# Patient Record
Sex: Female | Born: 1935 | Race: Black or African American | Hispanic: No | Smoking: Never smoker
Health system: Southern US, Community
[De-identification: ages and names within clinical notes are randomized; demographics above are authoritative.]

## PROBLEM LIST (undated history)

## (undated) DIAGNOSIS — E039 Hypothyroidism, unspecified: Secondary | ICD-10-CM

## (undated) DIAGNOSIS — H409 Unspecified glaucoma: Secondary | ICD-10-CM

## (undated) DIAGNOSIS — H5440 Blindness, one eye, unspecified eye: Secondary | ICD-10-CM

## (undated) DIAGNOSIS — I10 Essential (primary) hypertension: Secondary | ICD-10-CM

## (undated) HISTORY — PX: THYROID SURGERY: SHX805

## (undated) HISTORY — PX: ABDOMINAL HYSTERECTOMY: SHX81

## (undated) HISTORY — PX: CHOLECYSTECTOMY: SHX55

## (undated) HISTORY — PX: TONSILLECTOMY: SUR1361

## (undated) HISTORY — PX: APPENDECTOMY: SHX54

## (undated) HISTORY — PX: EYE SURGERY: SHX253

---

## 2002-04-22 ENCOUNTER — Encounter: Payer: Self-pay | Admitting: Family Medicine

## 2002-04-22 ENCOUNTER — Ambulatory Visit (HOSPITAL_COMMUNITY): Admission: RE | Admit: 2002-04-22 | Discharge: 2002-04-22 | Payer: Self-pay | Admitting: Family Medicine

## 2004-12-08 ENCOUNTER — Ambulatory Visit (HOSPITAL_COMMUNITY): Admission: RE | Admit: 2004-12-08 | Discharge: 2004-12-08 | Payer: Self-pay | Admitting: Family Medicine

## 2005-01-16 ENCOUNTER — Emergency Department (HOSPITAL_COMMUNITY): Admission: EM | Admit: 2005-01-16 | Discharge: 2005-01-16 | Payer: Self-pay | Admitting: Emergency Medicine

## 2005-10-25 ENCOUNTER — Emergency Department (HOSPITAL_COMMUNITY): Admission: EM | Admit: 2005-10-25 | Discharge: 2005-10-25 | Payer: Self-pay | Admitting: Emergency Medicine

## 2006-09-04 ENCOUNTER — Ambulatory Visit (HOSPITAL_COMMUNITY): Admission: RE | Admit: 2006-09-04 | Discharge: 2006-09-04 | Payer: Self-pay | Admitting: Family Medicine

## 2006-09-21 ENCOUNTER — Ambulatory Visit (HOSPITAL_COMMUNITY): Admission: RE | Admit: 2006-09-21 | Discharge: 2006-09-21 | Payer: Self-pay | Admitting: Family Medicine

## 2010-06-18 NOTE — Procedures (Signed)
NAME:  [name] [name], [name] [name] NO.:  [id number]   MEDICAL RECORD NO.:  [id number]          PATIENT TYPE:  EMS   LOCATION:  ED                            FACILITY:  [facility]   PHYSICIAN:  Edward L. Juanetta Gosling, M.D.DATE OF BIRTH:  06/30/1931   DATE OF PROCEDURE:  01/18/2005  DATE OF DISCHARGE:  01/16/2005                                EKG INTERPRETATION   The rhythm is sinus tachycardia with a rate of about 1:10.  QRS becomes  positive very early in the precordial leads.  There is probable left atrial  enlargement, and there are diffuse ST-T wave changes which are nonspecific.  Had normal electrocardiogram.      Ramon Dredge L. Juanetta Gosling, M.D.  Electronically Signed     ELH/MEDQ  D:  01/18/2005  T:  01/19/2005  Job:  [id number]

## 2010-06-21 ENCOUNTER — Emergency Department (HOSPITAL_COMMUNITY): Payer: Medicare (Managed Care)

## 2010-06-21 ENCOUNTER — Emergency Department (HOSPITAL_COMMUNITY)
Admission: EM | Admit: 2010-06-21 | Discharge: 2010-06-21 | Disposition: A | Discharge status: Home / Self Care | Payer: Medicare (Managed Care) | Attending: Emergency Medicine | Admitting: Emergency Medicine

## 2010-06-21 DIAGNOSIS — Z7982 Long term (current) use of aspirin: Secondary | ICD-10-CM | POA: Insufficient documentation

## 2010-06-21 DIAGNOSIS — E119 Type 2 diabetes mellitus without complications: Secondary | ICD-10-CM | POA: Insufficient documentation

## 2010-06-21 DIAGNOSIS — H409 Unspecified glaucoma: Secondary | ICD-10-CM | POA: Insufficient documentation

## 2010-06-21 DIAGNOSIS — I10 Essential (primary) hypertension: Secondary | ICD-10-CM | POA: Insufficient documentation

## 2010-06-21 DIAGNOSIS — M549 Dorsalgia, unspecified: Secondary | ICD-10-CM | POA: Insufficient documentation

## 2010-06-21 DIAGNOSIS — Z79899 Other long term (current) drug therapy: Secondary | ICD-10-CM | POA: Insufficient documentation

## 2010-06-21 DIAGNOSIS — E039 Hypothyroidism, unspecified: Secondary | ICD-10-CM | POA: Insufficient documentation

## 2010-06-21 DIAGNOSIS — M25519 Pain in unspecified shoulder: Secondary | ICD-10-CM | POA: Insufficient documentation

## 2010-11-12 ENCOUNTER — Other Ambulatory Visit (HOSPITAL_COMMUNITY): Payer: Self-pay | Admitting: Family Medicine

## 2010-11-12 DIAGNOSIS — E232 Diabetes insipidus: Secondary | ICD-10-CM

## 2010-11-12 DIAGNOSIS — I10 Essential (primary) hypertension: Secondary | ICD-10-CM

## 2010-11-12 DIAGNOSIS — E785 Hyperlipidemia, unspecified: Secondary | ICD-10-CM

## 2010-12-06 ENCOUNTER — Ambulatory Visit (HOSPITAL_COMMUNITY)
Admission: RE | Admit: 2010-12-06 | Discharge: 2010-12-06 | Disposition: A | Discharge status: Home / Self Care | Payer: Medicare Other | Source: Ambulatory Visit | Attending: Family Medicine | Admitting: Family Medicine

## 2010-12-06 DIAGNOSIS — E785 Hyperlipidemia, unspecified: Secondary | ICD-10-CM | POA: Insufficient documentation

## 2010-12-06 DIAGNOSIS — I10 Essential (primary) hypertension: Secondary | ICD-10-CM | POA: Insufficient documentation

## 2010-12-06 DIAGNOSIS — R109 Unspecified abdominal pain: Secondary | ICD-10-CM | POA: Insufficient documentation

## 2010-12-06 DIAGNOSIS — E119 Type 2 diabetes mellitus without complications: Secondary | ICD-10-CM | POA: Insufficient documentation

## 2010-12-06 DIAGNOSIS — E232 Diabetes insipidus: Secondary | ICD-10-CM

## 2011-05-06 ENCOUNTER — Other Ambulatory Visit (HOSPITAL_COMMUNITY): Payer: Self-pay | Admitting: Family Medicine

## 2011-05-06 DIAGNOSIS — Z139 Encounter for screening, unspecified: Secondary | ICD-10-CM

## 2011-05-11 ENCOUNTER — Ambulatory Visit (HOSPITAL_COMMUNITY)
Admission: RE | Admit: 2011-05-11 | Discharge: 2011-05-11 | Disposition: A | Discharge status: Home / Self Care | Payer: Medicare Other | Source: Ambulatory Visit | Attending: Family Medicine | Admitting: Family Medicine

## 2011-05-11 DIAGNOSIS — Z780 Asymptomatic menopausal state: Secondary | ICD-10-CM | POA: Insufficient documentation

## 2011-05-11 DIAGNOSIS — M899 Disorder of bone, unspecified: Secondary | ICD-10-CM | POA: Insufficient documentation

## 2011-05-19 ENCOUNTER — Encounter (HOSPITAL_COMMUNITY): Payer: Self-pay

## 2011-05-19 ENCOUNTER — Ambulatory Visit (HOSPITAL_COMMUNITY)
Admission: RE | Admit: 2011-05-19 | Discharge: 2011-05-19 | Disposition: A | Discharge status: Home / Self Care | Payer: Medicare Other | Source: Ambulatory Visit | Attending: Family Medicine | Admitting: Family Medicine

## 2011-05-19 DIAGNOSIS — Z1231 Encounter for screening mammogram for malignant neoplasm of breast: Secondary | ICD-10-CM | POA: Insufficient documentation

## 2011-05-19 DIAGNOSIS — Z139 Encounter for screening, unspecified: Secondary | ICD-10-CM

## 2011-05-19 HISTORY — DX: Essential (primary) hypertension: I10

## 2012-08-02 ENCOUNTER — Other Ambulatory Visit (HOSPITAL_COMMUNITY): Payer: Self-pay | Admitting: Family Medicine

## 2012-08-02 DIAGNOSIS — Z139 Encounter for screening, unspecified: Secondary | ICD-10-CM

## 2012-08-07 ENCOUNTER — Ambulatory Visit (HOSPITAL_COMMUNITY)
Admission: RE | Admit: 2012-08-07 | Discharge: 2012-08-07 | Disposition: A | Discharge status: Home / Self Care | Payer: Medicare Other | Source: Ambulatory Visit | Attending: Family Medicine | Admitting: Family Medicine

## 2012-08-07 DIAGNOSIS — Z139 Encounter for screening, unspecified: Secondary | ICD-10-CM

## 2012-08-07 DIAGNOSIS — Z1231 Encounter for screening mammogram for malignant neoplasm of breast: Secondary | ICD-10-CM | POA: Insufficient documentation

## 2012-08-14 ENCOUNTER — Encounter (HOSPITAL_COMMUNITY): Payer: Self-pay | Admitting: Pharmacy Technician

## 2012-08-15 ENCOUNTER — Other Ambulatory Visit: Payer: Self-pay | Admitting: Ophthalmology

## 2012-08-15 DIAGNOSIS — H4051X0 Glaucoma secondary to other eye disorders, right eye, stage unspecified: Secondary | ICD-10-CM

## 2012-08-15 MED ORDER — TETRACAINE HCL 0.5 % OP SOLN: 1.0000 [drp] | OPHTHALMIC | Status: DC

## 2012-08-15 MED ORDER — MITOMYCIN 0.2 MG OP KIT: 0.2000 mg | PACK | Freq: Once | OPHTHALMIC | Status: DC

## 2012-08-15 NOTE — H&P (Signed)
History & Physical:   DATE:   07-19-12  NAME:  [name] [name], [name] [name]      [id number]       HISTORY OF PRESENT ILLNESS: Chief Eye Complaints   Glaucoma  patient  : 1 month follow up from D/C Travatan Z and switching to Lumingan.  patient  states she hasn't noticed any reactions from switching drops  HPI: EYES: Reports symptoms of     LOCATION:      QUALITY/COURSE:   Reports condition is   INTENSITY/SEVERITY:    Reports measurement ( or degree) as   DURATION:   Reports the general length of symptoms to be   ONSET/TIMING:   Reports occurrence as   CONTEXT/WHEN:   Reports usually associated with   MODIFIERS/TREATMENTS:  Improved by              ROS:   GEN- Constitutional: HENT: GEN - Endocrine: Reports symptoms of diabetes.    hypothyroid LUNGS/Respiratory:  HEART/Cardiovascular: Reports symptoms of hypertension.    ABD/Gastrointestinal:   Musculoskeletal (BJE): Reports symptoms of knee pain or problems.    NEURO/Neurological: PSYCH/Psychiatric:    Is the pt oriented to time, place,yes person?  Mood depressed __ normal  agitated __  ACTIVE PROBLEMS: Chronic angle-closure glaucoma   ICD#365.23  with preexisting scarring un controlled OS despite MTMT Progressive angle closure glaucoma with arcuate defect OD, OS with double arcuate defect and nasal step indicating progressive loss of vision and severe optic nerve damage left eye Ptosis   ICD#374.30   Bilateral pseudophakia Bilateral previous penetrating keratoplasty Diabetes - Type 2   ICD#250.00  no diabetic retinopathy detected Hypertensive retinopathy   ICD#362.11  mild  SURGERIES: SLT 3-4 times OU  Dr. Jola Babinski List - Surgeries    phaco emulsion cataract extraction w/IOL and cornea transplant OU Dr.  Cleora Fleet [facility] in [place]   years ago  MEDICATIONS: Lumigan: 0.01% solution SIG-  1 gtt ou qhs   1 gtt OU qhs  Cosopt (Dorzolamide-Hydrochloride-Timolol Mal):    2%-0.5% (solution)    SIG-    1 milliliter(s)  drop     2 times a day              Brimonidine Tartrate: Strength-  SIG-    OU BID  Metformin (Glucophage):   1000 mg tablet  SIG-  1 tab(s)   2 times a day    Aspirin:  81 mg tablet  SIG-  1 each   once a day    Amlodipine (Norvasc):   10 mg tablet  SIG-  1 each   once a day    Levothyroxine (Synthroid):   100 mcg (0.1 mg) tablet  SIG-  1 each   once a day  REVIEW OF SYSTEMS: not found  TOBACCO: Never smoker   ICD#V13.89   Smoker Status:     Tobacco use:     Tobacco cessation:  SOCIAL HISTORY: Herbalist List - Social History  FAMILY HISTORY: Positive family history for  -   Diabetes/HTN Negative family history for  -   PARENTS: Mother CHILDREN: Son Diabetes GRANDPARENTS: SIBLINGS: UNCLES/AUNTS: OTHERS/DISTANT:  ALLERGIES: diamox caused sickness HYDROCODONE - (e.g. VICODIN):   Starter - Allergies - Summary:  PHYSICAL EXAMINATION: Exam: GENERAL: Appearance: General appearance can be described as well-nourished, well-developed, and in no acute distress.         [name]  OD:cc 20/50 + ph   20/40- OS:cc 20/50+  ph  NI  EYEGLASSES:  OD:-1.00 + 1.00 x 003                                           OS:- 2.75 + 2.00 x 141 ADD:+3.00  MR   OD   defer OS ADD  VF:  OD   full to confrontation testing         OS full to confrontation testing  Motility orthophoria and full  PUPILS: 4 mm each eye sluggish reaction each eye  EYELIDS & OCULAR ADNEXA ptosis each eye  SLE: Conjunctiva 1 hyperemia OU , OS superior nasal & temp conj mobile   Cornea arcus decreased tear film plus one to 2 staining each eye The patient has a clear corneal graft both eyes   anterior chamber  deep and quiet each eye  Iris Brown each eye with atrophy noted OD  Lens Posterior chamber intraocular lens implant each eye  Vitreous  CCT:06/15/2012 11:59  OD:643 OS:611   Ta   in mmHg    OD 20          OS 25 Time 10:45 AM  Gonio  OD reveals an inferior  peripheral anterior synechiae trabecular meshwork visible at 3:30 to 4:30 large  PAS at 9:00.  Angle open from 9:30 to 12:00 PAS noted at 12:00 nasal angle closed   OS angle closed from 6:00 to 9:00 angle open to posterior trabecular meshwork from 9:00 to 12:00 large peripheral anterior synechiae noted at 1 to 2:00 angle open from 1:00 to 3:00 rest of the angle was closed   Dilation:cyclopentolate1% OU (for OCT) not full dilated because of elevated intraocular pressure and several areas of angle-closure  Fundus:  optic nerve  OD  temporal rim discoloration and sloping and with 65% cupping                                                                   OS 90% vertical cup.  Thin temporal rim with discoloration   Macula      OD  the light reflex                OS the light reflex  Vessels narrow arterioles  Periphery retina appears flat each eye  OCT: ZO:XWRUE nasal defect OS: very thin NFL   Exam: GENERAL: Appearance: General appearance can be described as well-nourished, well-developed, and in no acute distress.    LYMPHATIC: HEAD, EARS, NOSE AND THROAT: Ears-Nose (external) Inspection: Externally, nose and ears are normal in appearance and without scars, lesions, or nodules.      Otoscopic Exam: External auditory canals and tympanic membranes are normal.      Hearing assessment shows no problems with normal conversation.    Nose exam, internally, reveals nasal mucosa, septum and turbinates are unremarkable.    Teeth, Gingiva, and Lip Exams: No lesions or evidence of infection.      Oropharynx demonstrates oral mucosa, salivary glands, tongue, tonsils, posterior pharynx, hard-soft palates are normal.  EYES: see above  NECK: Neck tissue exam demonstrates no masses, symmetrical, and trachea  is midline.      LUNGS and RESPIRATORY: Lung auscultation elicits no wheezing, rhonci, rales or rubs and with equal breath sounds.    Respiratory effort described as breathing is unlabored and  chest movement is symmetrical.    HEART (Cardiovascular): Heart auscultation discovers regular rate and rhythm; no murmur, gallop or rub. Normal heart sounds.    ABDOMEN (Gastrointestinal): Mass/Tenderness Exam: Neither are present.     Liver/Spleen: No hepatomegaly or splenomegaly.   MUSCULOSKELETAL (BJE): Inspection-Palpation: No major bone, joint, tendon, or muscle changes.      NEUROLOGICAL: Alert and oriented. No major deficits of coordination or sensation.      PSYCHIATRIC: Insight and judgment appear  both to be intact and appropriate.    Mood and affect are described as normal mood and full affect.    SKIN: Skin Inspection: No rashes or lesions.     BP:. 130/66 PULSE:  72 RESP:16  ADMITTING DIAGNOSIS: Chronic angle-closure glaucoma   ICD#365.23  with preexisting scarring un controlled OS despite MTMT Progressive angle closure glaucoma with arcuate defect OD, OS with double arcuate defect and nasal step indicating progressive loss of vision and severe optic nerve damage left eye Ptosis   ICD#374.30   Bilateral pseudophakia Bilateral previous penetrating keratoplasty Diabetes - Type 2   ICD#250.00  no diabetic retinopathy detected Hypertensive retinopathy   ICD#362.11  mild  SURGICAL TREATMENT PLAN: Suggest trabec with [facility] with  PERIPHERAL  IRIDECTOMY   OS since MTMT not effective.  Risk and benefits of surgery have been reviewed with the patient and the patient agrees to proceed with the surgical procedure.    ___________________________ Chalmers Guest, Tawni Millers - Inactive Problems:

## 2012-08-15 NOTE — Pre-Procedure Instructions (Signed)
TEVA BRONKEMA  08/15/2012   Your procedure is scheduled on: Wednesday August 22, 2012  Report to [facility] at 0800 AM.  Call this number if you have problems the morning of surgery: [phone]   Remember:   Do not eat food or drink liquids after midnight.   Take these medicines the morning of surgery with A SIP OF WATER: Amlodipine, and Synthroid   Do not wear jewelry, make-up or nail polish.  Do not wear lotions, powders, or perfumes. You may wear deodorant.  Do not shave 48 hours prior to surgery.   Do not bring valuables to the hospital.  [facility] is not responsible for any belongings or valuables.  Contacts, dentures or bridgework may not be worn into surgery.  Leave suitcase in the car. After surgery it may be brought to your room.  For patients admitted to the hospital, checkout time is 11:00 AM the day of  discharge.   Patients discharged the day of surgery will not be allowed to drive  home.  Name and phone number of your driver:   Special Instructions: Shower using CHG 2 nights before surgery and the night before surgery.  If you shower the day of surgery use CHG.  Use special wash - you have one bottle of CHG for all showers.  You should use approximately 1/3 of the bottle for each shower.   Please read over the following fact sheets that you were given: Pain Booklet, Coughing and Deep Breathing and Surgical Site Infection Prevention

## 2012-08-15 NOTE — Progress Notes (Signed)
Dr Hosie Poisson office made aware that orders were needed for PAT at 0900 AM 08/16/12.

## 2012-08-16 ENCOUNTER — Encounter (HOSPITAL_COMMUNITY)
Admission: RE | Admit: 2012-08-16 | Discharge: 2012-08-16 | Disposition: A | Discharge status: Home / Self Care | Payer: Medicare Other | Source: Ambulatory Visit | Attending: Ophthalmology | Admitting: Ophthalmology

## 2012-08-16 ENCOUNTER — Encounter (HOSPITAL_COMMUNITY): Payer: Self-pay

## 2012-08-16 VITALS — BP 163/72 | HR 87 | Temp 98.2°F | Resp 18 | Ht 63.0 in | Wt 150.3 lb

## 2012-08-16 DIAGNOSIS — H4051X0 Glaucoma secondary to other eye disorders, right eye, stage unspecified: Secondary | ICD-10-CM

## 2012-08-16 HISTORY — DX: Hypothyroidism, unspecified: E03.9

## 2012-08-16 LAB — CBC
Hemoglobin: 12.8 g/dL (ref 12.0–15.0)
MCH: 28.4 pg (ref 26.0–34.0)
Platelets: 366 10*3/uL (ref 150–400)
RBC: 4.51 MIL/uL (ref 3.87–5.11)
WBC: 9.9 10*3/uL (ref 4.0–10.5)

## 2012-08-16 LAB — BASIC METABOLIC PANEL
Calcium: 10.4 mg/dL (ref 8.4–10.5)
GFR calc Af Amer: 75 mL/min — ABNORMAL LOW (ref >90)
GFR calc non Af Amer: 64 mL/min — ABNORMAL LOW (ref >90)
Potassium: 4.3 mEq/L (ref 3.5–5.1)
Sodium: 140 mEq/L (ref 135–145)

## 2012-08-21 ENCOUNTER — Other Ambulatory Visit: Payer: Self-pay | Admitting: Ophthalmology

## 2012-08-22 ENCOUNTER — Encounter (HOSPITAL_COMMUNITY): Payer: Self-pay | Admitting: Anesthesiology

## 2012-08-22 ENCOUNTER — Ambulatory Visit (HOSPITAL_COMMUNITY)
Admission: RE | Admit: 2012-08-22 | Discharge: 2012-08-22 | Disposition: A | Discharge status: Home / Self Care | Payer: Medicare Other | Source: Ambulatory Visit | Attending: Ophthalmology | Admitting: Ophthalmology

## 2012-08-22 ENCOUNTER — Encounter (HOSPITAL_COMMUNITY)
Admission: RE | Disposition: A | Discharge status: Home / Self Care | Payer: Self-pay | Source: Ambulatory Visit | Attending: Ophthalmology

## 2012-08-22 ENCOUNTER — Ambulatory Visit (HOSPITAL_COMMUNITY): Payer: Medicare Other | Admitting: Anesthesiology

## 2012-08-22 DIAGNOSIS — Z885 Allergy status to narcotic agent status: Secondary | ICD-10-CM | POA: Insufficient documentation

## 2012-08-22 DIAGNOSIS — F3289 Other specified depressive episodes: Secondary | ICD-10-CM | POA: Insufficient documentation

## 2012-08-22 DIAGNOSIS — H02409 Unspecified ptosis of unspecified eyelid: Secondary | ICD-10-CM | POA: Insufficient documentation

## 2012-08-22 DIAGNOSIS — Z79899 Other long term (current) drug therapy: Secondary | ICD-10-CM | POA: Insufficient documentation

## 2012-08-22 DIAGNOSIS — E119 Type 2 diabetes mellitus without complications: Secondary | ICD-10-CM | POA: Insufficient documentation

## 2012-08-22 DIAGNOSIS — F329 Major depressive disorder, single episode, unspecified: Secondary | ICD-10-CM | POA: Insufficient documentation

## 2012-08-22 DIAGNOSIS — H4020X0 Unspecified primary angle-closure glaucoma, stage unspecified: Secondary | ICD-10-CM | POA: Insufficient documentation

## 2012-08-22 DIAGNOSIS — H409 Unspecified glaucoma: Secondary | ICD-10-CM | POA: Insufficient documentation

## 2012-08-22 DIAGNOSIS — H4051X0 Glaucoma secondary to other eye disorders, right eye, stage unspecified: Secondary | ICD-10-CM

## 2012-08-22 DIAGNOSIS — Z7982 Long term (current) use of aspirin: Secondary | ICD-10-CM | POA: Insufficient documentation

## 2012-08-22 DIAGNOSIS — E039 Hypothyroidism, unspecified: Secondary | ICD-10-CM | POA: Insufficient documentation

## 2012-08-22 DIAGNOSIS — I10 Essential (primary) hypertension: Secondary | ICD-10-CM | POA: Insufficient documentation

## 2012-08-22 HISTORY — PX: TRABECULECTOMY: SHX107

## 2012-08-22 LAB — GLUCOSE, CAPILLARY: Glucose-Capillary: 125 mg/dL — ABNORMAL HIGH (ref 70–99)

## 2012-08-22 SURGERY — TRABECULECTOMY
Anesthesia: Monitor Anesthesia Care | Site: Eye | Laterality: Left | Wound class: Clean

## 2012-08-22 MED ORDER — PROPOFOL INFUSION 10 MG/ML OPTIME
INTRAVENOUS | Status: DC | PRN
  Administered 2012-08-22: 75 ug/kg/min via INTRAVENOUS

## 2012-08-22 MED ORDER — MITOMYCIN 0.2 MG OP KIT
0.2000 mg | PACK | OPHTHALMIC | Status: AC
  Administered 2012-08-22: .4 mg via OPHTHALMIC
  Filled 2012-08-22: qty 1

## 2012-08-22 MED ORDER — LIDOCAINE HCL 2 % IJ SOLN
INTRAMUSCULAR | Status: AC
  Filled 2012-08-22: qty 20

## 2012-08-22 MED ORDER — ATROPINE SULFATE 1 % OP SOLN
OPHTHALMIC | Status: AC
  Filled 2012-08-22: qty 2

## 2012-08-22 MED ORDER — SODIUM CHLORIDE 0.9 % IV SOLN
INTRAVENOUS | Status: DC
  Administered 2012-08-22: 09:00:00 via INTRAVENOUS

## 2012-08-22 MED ORDER — ACETYLCHOLINE CHLORIDE 1:100 IO SOLR
INTRAOCULAR | Status: AC
  Filled 2012-08-22: qty 1

## 2012-08-22 MED ORDER — LIDOCAINE HCL 1 % IJ SOLN
INTRAMUSCULAR | Status: DC | PRN
  Administered 2012-08-22: 50 mg via INTRADERMAL

## 2012-08-22 MED ORDER — BSS IO SOLN
INTRAOCULAR | Status: DC | PRN
  Administered 2012-08-22: 500 mL via INTRAOCULAR

## 2012-08-22 MED ORDER — TOBRAMYCIN 0.3 % OP OINT
TOPICAL_OINTMENT | OPHTHALMIC | Status: DC | PRN
  Administered 2012-08-22: 1 via OPHTHALMIC

## 2012-08-22 MED ORDER — SODIUM HYALURONATE 10 MG/ML IO SOLN
INTRAOCULAR | Status: DC | PRN
  Administered 2012-08-22: 0.85 mL via INTRAOCULAR

## 2012-08-22 MED ORDER — TRIAMCINOLONE ACETONIDE 40 MG/ML IJ SUSP
INTRAMUSCULAR | Status: DC | PRN
  Administered 2012-08-22: 40 mg

## 2012-08-22 MED ORDER — TRIAMCINOLONE ACETONIDE 40 MG/ML IJ SUSP
INTRAMUSCULAR | Status: AC
  Filled 2012-08-22: qty 5

## 2012-08-22 MED ORDER — LIDOCAINE-EPINEPHRINE 2 %-1:100000 IJ SOLN
INTRAMUSCULAR | Status: AC
  Filled 2012-08-22: qty 1

## 2012-08-22 MED ORDER — GATIFLOXACIN 0.5 % OP SOLN
OPHTHALMIC | Status: AC
  Administered 2012-08-22: 1 [drp]
  Filled 2012-08-22: qty 2.5

## 2012-08-22 MED ORDER — ATROPINE SULFATE 1 % OP OINT
TOPICAL_OINTMENT | OPHTHALMIC | Status: DC | PRN
  Administered 2012-08-22: 1 via OPHTHALMIC

## 2012-08-22 MED ORDER — SODIUM HYALURONATE 10 MG/ML IO SOLN
INTRAOCULAR | Status: AC
  Filled 2012-08-22: qty 0.85

## 2012-08-22 MED ORDER — FLUORESCEIN SODIUM 1 MG OP STRP
ORAL_STRIP | OPHTHALMIC | Status: DC | PRN
  Administered 2012-08-22: 1 via OPHTHALMIC

## 2012-08-22 MED ORDER — HYALURONIDASE HUMAN 150 UNIT/ML IJ SOLN
INTRAMUSCULAR | Status: AC
  Filled 2012-08-22: qty 1

## 2012-08-22 MED ORDER — PROPOFOL 10 MG/ML IV BOLUS
INTRAVENOUS | Status: DC | PRN
  Administered 2012-08-22 (×2): 30 mg via INTRAVENOUS

## 2012-08-22 MED ORDER — BUPIVACAINE HCL (PF) 0.75 % IJ SOLN
INTRAMUSCULAR | Status: AC
  Filled 2012-08-22: qty 10

## 2012-08-22 MED ORDER — TETRACAINE HCL 0.5 % OP SOLN
OPHTHALMIC | Status: AC
  Filled 2012-08-22: qty 2

## 2012-08-22 MED ORDER — SODIUM CHLORIDE 0.9 % IV SOLN
INTRAVENOUS | Status: DC | PRN
  Administered 2012-08-22: 10:00:00 via INTRAVENOUS

## 2012-08-22 MED ORDER — MIDAZOLAM HCL 5 MG/5ML IJ SOLN
INTRAMUSCULAR | Status: DC | PRN
  Administered 2012-08-22 (×2): 1 mg via INTRAVENOUS

## 2012-08-22 MED ORDER — FLUORESCEIN SODIUM 1 MG OP STRP
ORAL_STRIP | OPHTHALMIC | Status: AC
  Filled 2012-08-22: qty 2

## 2012-08-22 MED ORDER — EPINEPHRINE HCL 1 MG/ML IJ SOLN
INTRAMUSCULAR | Status: AC
  Filled 2012-08-22: qty 1

## 2012-08-22 MED ORDER — BSS IO SOLN
INTRAOCULAR | Status: AC
  Filled 2012-08-22: qty 15

## 2012-08-22 MED ORDER — TOBRAMYCIN-DEXAMETHASONE 0.3-0.1 % OP OINT
TOPICAL_OINTMENT | OPHTHALMIC | Status: AC
  Filled 2012-08-22: qty 3.5

## 2012-08-22 MED ORDER — LIDOCAINE-EPINEPHRINE 2 %-1:100000 IJ SOLN
INTRAMUSCULAR | Status: DC | PRN
  Administered 2012-08-22: 11:00:00 via RETROBULBAR

## 2012-08-22 MED ORDER — BSS IO SOLN
INTRAOCULAR | Status: AC
  Filled 2012-08-22: qty 500

## 2012-08-22 MED ORDER — GATIFLOXACIN 0.5 % OP SOLN
1.0000 [drp] | OPHTHALMIC | Status: AC
  Administered 2012-08-22 (×3): 1 [drp] via OPHTHALMIC

## 2012-08-22 MED ORDER — FENTANYL CITRATE 0.05 MG/ML IJ SOLN
INTRAMUSCULAR | Status: DC | PRN
  Administered 2012-08-22 (×3): 50 ug via INTRAVENOUS

## 2012-08-22 MED ORDER — ACETAMINOPHEN 325 MG PO TABS
ORAL_TABLET | ORAL | Status: AC
  Administered 2012-08-22: 325 mg via ORAL
  Filled 2012-08-22: qty 2

## 2012-08-22 SURGICAL SUPPLY — 50 items
APL SRG 3 HI ABS STRL LF PLS (MISCELLANEOUS) ×1
APPLICATOR COTTON TIP 6IN STRL (MISCELLANEOUS) ×2 IMPLANT
APPLICATOR DR MATTHEWS STRL (MISCELLANEOUS) ×2 IMPLANT
BLADE EYE CATARACT 19 1.4 BEAV (BLADE) ×1 IMPLANT
BLADE MINI RND TIP GREEN BEAV (BLADE) IMPLANT
BLADE STAB KNIFE 45DEG (BLADE) ×2 IMPLANT
CANISTER SUCTION 2500CC (MISCELLANEOUS) ×1 IMPLANT
CLOTH BEACON ORANGE TIMEOUT ST (SAFETY) ×2 IMPLANT
CORDS BIPOLAR (ELECTRODE) ×2 IMPLANT
COVER MAYO STAND STRL (DRAPES) ×1 IMPLANT
DRAPE OPHTHALMIC 40X48 W POUCH (DRAPES) ×2 IMPLANT
DRAPE RETRACTOR (MISCELLANEOUS) ×2 IMPLANT
ERASER HMR WETFIELD 23G BP (MISCELLANEOUS) IMPLANT
GLOVE BIO SURGEON STRL SZ8 (GLOVE) ×2 IMPLANT
GLOVE BIOGEL PI IND STRL 7.0 (GLOVE) IMPLANT
GLOVE BIOGEL PI INDICATOR 7.0 (GLOVE) ×1
GLOVE ECLIPSE 6.5 STRL STRAW (GLOVE) ×1 IMPLANT
GLOVE ECLIPSE 7.0 STRL STRAW (GLOVE) ×2 IMPLANT
GLOVE SURG SS PI 6.5 STRL IVOR (GLOVE) ×1 IMPLANT
GOWN STRL NON-REIN LRG LVL3 (GOWN DISPOSABLE) ×5 IMPLANT
KIT BASIN OR (CUSTOM PROCEDURE TRAY) ×2 IMPLANT
KIT ROOM TURNOVER OR (KITS) ×2 IMPLANT
KNIFE GRIESHABER SHARP 2.5MM (MISCELLANEOUS) ×2 IMPLANT
MASK EYE SHIELD (GAUZE/BANDAGES/DRESSINGS) ×1 IMPLANT
NDL 25GX 5/8IN NON SAFETY (NEEDLE) ×1 IMPLANT
NDL HYPO 30X.5 LL (NEEDLE) ×1 IMPLANT
NEEDLE 25GX 5/8IN NON SAFETY (NEEDLE) ×2 IMPLANT
NEEDLE HYPO 30X.5 LL (NEEDLE) ×4 IMPLANT
NS IRRIG 1000ML POUR BTL (IV SOLUTION) ×2 IMPLANT
PACK CATARACT CUSTOM (CUSTOM PROCEDURE TRAY) ×2 IMPLANT
PAD ARMBOARD 7.5X6 YLW CONV (MISCELLANEOUS) ×4 IMPLANT
PAD EYE OVAL STERILE LF (GAUZE/BANDAGES/DRESSINGS) ×2 IMPLANT
SPEAR EYE SURG WECK-CEL (MISCELLANEOUS) IMPLANT
SPECIMEN JAR SMALL (MISCELLANEOUS) IMPLANT
SPONGE SURGIFOAM ABS GEL 12-7 (HEMOSTASIS) ×2 IMPLANT
STRIP CLOSURE SKIN 1/2X4 (GAUZE/BANDAGES/DRESSINGS) ×1 IMPLANT
SUT ETHILON 10 0 CS140 6 (SUTURE) ×2 IMPLANT
SUT ETHILON 9 0 BV100 4 (SUTURE) ×1 IMPLANT
SUT SILK 6 0 G 6 (SUTURE) ×2 IMPLANT
SUT VICRYL 9-0 (SUTURE) ×1 IMPLANT
SYR 20CC LL (SYRINGE) ×4 IMPLANT
SYR 50ML SLIP (SYRINGE) ×2 IMPLANT
SYR TB 1ML LUER SLIP (SYRINGE) IMPLANT
TAPE PAPER MEDFIX 1IN X 10YD (GAUZE/BANDAGES/DRESSINGS) ×1 IMPLANT
TAPE SURG TRANSPORE 1 IN (GAUZE/BANDAGES/DRESSINGS) IMPLANT
TAPE SURGICAL TRANSPORE 1 IN (GAUZE/BANDAGES/DRESSINGS) ×1
TOWEL OR 17X24 6PK STRL BLUE (TOWEL DISPOSABLE) ×4 IMPLANT
TUBE CONNECTING 12X1/4 (SUCTIONS) ×1 IMPLANT
WATER STERILE IRR 1000ML POUR (IV SOLUTION) ×2 IMPLANT
WIPE INSTRUMENT VISIWIPE 73X73 (MISCELLANEOUS) ×2 IMPLANT

## 2012-08-22 NOTE — Preoperative (Signed)
Beta Blockers   Reason not to administer Beta Blockers:Not Applicable 

## 2012-08-22 NOTE — Anesthesia Postprocedure Evaluation (Signed)
Anesthesia Post Note  Patient: [name] [name]  Procedure(s) Performed: Procedure(s) (LRB): TRABECULECTOMY (Left) MITOMYCIN C APPLICATION (Left)  Anesthesia type: MAC  Patient location: PACU  Post pain: Pain level controlled  Post assessment: Patient's Cardiovascular Status Stable  Last Vitals:  Filed Vitals:   08/22/12 1230  BP:   Pulse: 72  Temp:   Resp:     Post vital signs: Reviewed and stable  Level of consciousness: alert  Complications: No apparent anesthesia complications

## 2012-08-22 NOTE — H&P (View-Only) (Signed)
                  History & Physical:   DATE:   07-19-12  NAME:  [name], [name]      [id number]       HISTORY OF PRESENT ILLNESS: Chief Eye Complaints   Glaucoma  patient  : 1 month follow up from D/C Travatan Z and switching to Lumingan.  patient  states she hasn't noticed any reactions from switching drops  HPI: EYES: Reports symptoms of     LOCATION:      QUALITY/COURSE:   Reports condition is   INTENSITY/SEVERITY:    Reports measurement ( or degree) as   DURATION:   Reports the general length of symptoms to be   ONSET/TIMING:   Reports occurrence as   CONTEXT/WHEN:   Reports usually associated with   MODIFIERS/TREATMENTS:  Improved by              ROS:   GEN- Constitutional: HENT: GEN - Endocrine: Reports symptoms of diabetes.    hypothyroid LUNGS/Respiratory:  HEART/Cardiovascular: Reports symptoms of hypertension.    ABD/Gastrointestinal:   Musculoskeletal (BJE): Reports symptoms of knee pain or problems.    NEURO/Neurological: PSYCH/Psychiatric:    Is the pt oriented to time, place,yes person?  Mood depressed __ normal  agitated __  ACTIVE PROBLEMS: Chronic angle-closure glaucoma   ICD#365.23  with preexisting scarring un controlled OS despite MTMT Progressive angle closure glaucoma with arcuate defect OD, OS with double arcuate defect and nasal step indicating progressive loss of vision and severe optic nerve damage left eye Ptosis   ICD#374.30   Bilateral pseudophakia Bilateral previous penetrating keratoplasty Diabetes - Type 2   ICD#250.00  no diabetic retinopathy detected Hypertensive retinopathy   ICD#362.11  mild  SURGERIES: SLT 3-4 times OU  Dr. Kowalski Pick List - Surgeries    phaco emulsion cataract extraction w/IOL and cornea transplant OU Dr.  Semchyshyn [facility] in [place]   years ago  MEDICATIONS: Lumigan: 0.01% solution SIG-  1 gtt ou qhs   1 gtt OU qhs  Cosopt (Dorzolamide-Hydrochloride-Timolol Mal):    2%-0.5% (solution)    SIG-    1 milliliter(s)  drop     2 times a day              Brimonidine Tartrate: Strength-  SIG-    OU BID  Metformin (Glucophage):   1000 mg tablet  SIG-  1 tab(s)   2 times a day    Aspirin:  81 mg tablet  SIG-  1 each   once a day    Amlodipine (Norvasc):   10 mg tablet  SIG-  1 each   once a day    Levothyroxine (Synthroid):   100 mcg (0.1 mg) tablet  SIG-  1 each   once a day  REVIEW OF SYSTEMS: not found  TOBACCO: Never smoker   ICD#V13.89   Smoker Status:     Tobacco use:     Tobacco cessation:  SOCIAL HISTORY: Starter Pick List - Social History  FAMILY HISTORY: Positive family history for  -   Diabetes/HTN Negative family history for  -   PARENTS: Mother CHILDREN: Son Diabetes GRANDPARENTS: SIBLINGS: UNCLES/AUNTS: OTHERS/DISTANT:  ALLERGIES: diamox caused sickness HYDROCODONE - (e.g. VICODIN):   Starter - Allergies - Summary:  PHYSICAL EXAMINATION: Exam: GENERAL: Appearance: General appearance can be described as well-nourished, well-developed, and in no acute distress.         Va       OD:cc 20/50 + ph   20/40- OS:cc 20/50+  ph  NI  EYEGLASSES:  OD:-1.00 + 1.00 x 003                                           OS:- 2.75 + 2.00 x 141 ADD:+3.00  MR   OD   defer OS ADD  VF:  OD   full to confrontation testing         OS full to confrontation testing  Motility orthophoria and full  PUPILS: 4 mm each eye sluggish reaction each eye  EYELIDS & OCULAR ADNEXA ptosis each eye  SLE: Conjunctiva 1 hyperemia OU , OS superior nasal & temp conj mobile   Cornea arcus decreased tear film plus one to 2 staining each eye The patient has a clear corneal graft both eyes   anterior chamber  deep and quiet each eye  Iris Brown each eye with atrophy noted OD  Lens Posterior chamber intraocular lens implant each eye  Vitreous  CCT:06/15/2012 11:59  OD:643 OS:611   Ta   in mmHg    OD 20          OS 25 Time 10:45 AM  Gonio  OD reveals an inferior  peripheral anterior synechiae trabecular meshwork visible at 3:30 to 4:30 large  PAS at 9:00.  Angle open from 9:30 to 12:00 PAS noted at 12:00 nasal angle closed   OS angle closed from 6:00 to 9:00 angle open to posterior trabecular meshwork from 9:00 to 12:00 large peripheral anterior synechiae noted at 1 to 2:00 angle open from 1:00 to 3:00 rest of the angle was closed   Dilation:cyclopentolate1% OU (for OCT) not full dilated because of elevated intraocular pressure and several areas of angle-closure  Fundus:  optic nerve  OD  temporal rim discoloration and sloping and with 65% cupping                                                                   OS 90% vertical cup.  Thin temporal rim with discoloration   Macula      OD  the light reflex                OS the light reflex  Vessels narrow arterioles  Periphery retina appears flat each eye  OCT: OD:small nasal defect OS: very thin NFL   Exam: GENERAL: Appearance: General appearance can be described as well-nourished, well-developed, and in no acute distress.    LYMPHATIC: HEAD, EARS, NOSE AND THROAT: Ears-Nose (external) Inspection: Externally, nose and ears are normal in appearance and without scars, lesions, or nodules.      Otoscopic Exam: External auditory canals and tympanic membranes are normal.      Hearing assessment shows no problems with normal conversation.    Nose exam, internally, reveals nasal mucosa, septum and turbinates are unremarkable.    Teeth, Gingiva, and Lip Exams: No lesions or evidence of infection.      Oropharynx demonstrates oral mucosa, salivary glands, tongue, tonsils, posterior pharynx, hard-soft palates are normal.  EYES: see above  NECK: Neck tissue exam demonstrates no masses, symmetrical, and trachea   is midline.      LUNGS and RESPIRATORY: Lung auscultation elicits no wheezing, rhonci, rales or rubs and with equal breath sounds.    Respiratory effort described as breathing is unlabored and  chest movement is symmetrical.    HEART (Cardiovascular): Heart auscultation discovers regular rate and rhythm; no murmur, gallop or rub. Normal heart sounds.    ABDOMEN (Gastrointestinal): Mass/Tenderness Exam: Neither are present.     Liver/Spleen: No hepatomegaly or splenomegaly.   MUSCULOSKELETAL (BJE): Inspection-Palpation: No major bone, joint, tendon, or muscle changes.      NEUROLOGICAL: Alert and oriented. No major deficits of coordination or sensation.      PSYCHIATRIC: Insight and judgment appear  both to be intact and appropriate.    Mood and affect are described as normal mood and full affect.    SKIN: Skin Inspection: No rashes or lesions.     BP:. 130/66 PULSE:  72 RESP:16  ADMITTING DIAGNOSIS: Chronic angle-closure glaucoma   ICD#365.23  with preexisting scarring un controlled OS despite MTMT Progressive angle closure glaucoma with arcuate defect OD, OS with double arcuate defect and nasal step indicating progressive loss of vision and severe optic nerve damage left eye Ptosis   ICD#374.30   Bilateral pseudophakia Bilateral previous penetrating keratoplasty Diabetes - Type 2   ICD#250.00  no diabetic retinopathy detected Hypertensive retinopathy   ICD#362.11  mild  SURGICAL TREATMENT PLAN: Suggest trabec with [facility] with  PERIPHERAL  IRIDECTOMY   OS since MTMT not effective.  Risk and benefits of surgery have been reviewed with the patient and the patient agrees to proceed with the surgical procedure.    ___________________________ Olive Motyka, Jr. Starter - Inactive Problems:  

## 2012-08-22 NOTE — Op Note (Signed)
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 Following this the patient's face was prepped and draped in the usual sterile fashion with a surgeon sitting at 12:00 and the operating microscope in position a muscle hook was used to infraducted the eye. Following this a 6-0 nylon suture was passed through clear cornea to infraducted the eye at this point a Hoskins forceps was used to grasp the conjunctiva at the limbus and a Wescott scissors were used to make an incision at the limbus a blunt Wescott was used to extend the incision posteriorly forming a fornix-based conjunctival flap. Tooke blade was used to recess T9 fibers bleeding was controlled with cautery. Following this a scleral flap with the base of 4 mm half scleral thickness was fashioned with the 45 blade the [place] Colibri forceps were then used with the 5700 blade to dissect up to the corneal scleral limbus. All in this mitomycin-C 0.4 mg per cc was placed on a pre-soaked sponge and allowed to stay under the conjunctiva for 2 minutes the sponge was then removed and irrigation occurred with 40 cc of balanced salt solution following this using a 5100 Beaver blade through inferior temporal clear cornea an incision was made into the anterior chamber and a small amount of Provisc was injected in the anterior chamber it is of note that the patient had a posterior chamber intraocular lens implant in place. Following this the scleral flap was elevated and a 45 blade was used under the scleral flap to into the anterior chamber and Descemet's  punch was then used to remove a 1 x 3 block of tissue there basal peripheral iridectomy was formed using the Vannas scissors and Chandler forceps. Following this 3 interrupted 10-0 nylon sutures were placed to secure the scleral flap the conjunctiva was then sutured using a 9-0 Vicryl on a BV 100 needle BSS was injected in the anterior chamber and additional suture was placed to achieve watertight closure the incision was tested with a fluorescein sponge and noted to be Seidel negative. A subconjunctival injection of Kenalog 4 mg is given in the inferior nasal subconjunctival space following this the fixation suture was removed the anterior chamber was deep there was no leakage topical atropine drops were applied to the eye topical TobraDex ointment was applied a patch and Fox U. were placed and the patient returned to recovery area in stable condition [employer] M.D.

## 2012-08-22 NOTE — Transfer of Care (Signed)
Immediate Anesthesia Transfer of Care Note  Patient: [name] [name]  Procedure(s) Performed: Procedure(s): TRABECULECTOMY (Left) MITOMYCIN C APPLICATION (Left)  Patient Location: PACU  Anesthesia Type:MAC  Level of Consciousness: awake and alert   Airway & Oxygen Therapy: Patient Spontanous Breathing  Post-op Assessment: Report given to PACU RN and Post -op Vital signs reviewed and stable  Post vital signs: Reviewed and stable  Complications: No apparent anesthesia complications

## 2012-08-22 NOTE — Anesthesia Preprocedure Evaluation (Addendum)
Anesthesia Evaluation  Patient identified by MRN, date of birth, ID band Patient awake    Reviewed: Allergy & Precautions, H&P , NPO status , Patient's Chart, lab work & pertinent test results, reviewed documented beta blocker date and time   Airway Mallampati: I TM Distance: >3 FB Neck ROM: Full    Dental  (+) Edentulous Upper and Edentulous Lower   Pulmonary          Cardiovascular hypertension, Pt. on medications Rhythm:Regular     Neuro/Psych    GI/Hepatic   Endo/Other  diabetes, Type 2, Oral Hypoglycemic AgentsHypothyroidism   Renal/GU      Musculoskeletal   Abdominal   Peds  Hematology   Anesthesia Other Findings   Reproductive/Obstetrics                          Anesthesia Physical Anesthesia Plan  ASA: II  Anesthesia Plan: MAC   Post-op Pain Management:    Induction: Intravenous  Airway Management Planned: Simple Face Mask  Additional Equipment:   Intra-op Plan:   Post-operative Plan:   Informed Consent: I have reviewed the patients History and Physical, chart, labs and discussed the procedure including the risks, benefits and alternatives for the proposed anesthesia with the patient or authorized representative who has indicated his/her understanding and acceptance.     Plan Discussed with: CRNA and Surgeon  Anesthesia Plan Comments:         Anesthesia Quick Evaluation

## 2012-08-22 NOTE — Interval H&P Note (Signed)
History and Physical Interval Note:  08/22/2012 10:18 AM  [name] [name]  has presented today for surgery, with the diagnosis of CHRONIC ANGLE-CLOSURE GLAUCOMA  The various methods of treatment have been discussed with the patient and family. After consideration of risks, benefits and other options for treatment, the patient has consented to  Procedure(s): TRABECULECTOMY (Left) MITOMYCIN C APPLICATION (Left) as a surgical intervention .  The patient's history has been reviewed, patient examined, no change in status, stable for surgery.  I have reviewed the patient's chart and labs.  Questions were answered to the patient's satisfaction.     [name]

## 2012-08-23 ENCOUNTER — Encounter (HOSPITAL_COMMUNITY): Payer: Self-pay | Admitting: Ophthalmology

## 2013-07-16 ENCOUNTER — Other Ambulatory Visit (HOSPITAL_COMMUNITY): Payer: Self-pay | Admitting: Family Medicine

## 2013-07-16 DIAGNOSIS — Z1231 Encounter for screening mammogram for malignant neoplasm of breast: Secondary | ICD-10-CM

## 2013-08-08 ENCOUNTER — Ambulatory Visit (HOSPITAL_COMMUNITY): Payer: Medicare Other

## 2013-08-12 ENCOUNTER — Ambulatory Visit (HOSPITAL_COMMUNITY)
Admission: RE | Admit: 2013-08-12 | Discharge: 2013-08-12 | Disposition: A | Discharge status: Home / Self Care | Payer: Medicare HMO | Source: Ambulatory Visit | Attending: Family Medicine | Admitting: Family Medicine

## 2013-08-12 DIAGNOSIS — Z1231 Encounter for screening mammogram for malignant neoplasm of breast: Secondary | ICD-10-CM

## 2014-08-15 ENCOUNTER — Other Ambulatory Visit (HOSPITAL_COMMUNITY): Payer: Self-pay | Admitting: Family Medicine

## 2014-08-15 DIAGNOSIS — Z1231 Encounter for screening mammogram for malignant neoplasm of breast: Secondary | ICD-10-CM

## 2014-08-21 ENCOUNTER — Ambulatory Visit (HOSPITAL_COMMUNITY)
Admission: RE | Admit: 2014-08-21 | Discharge: 2014-08-21 | Disposition: A | Discharge status: Home / Self Care | Payer: Medicare HMO | Source: Ambulatory Visit | Attending: Family Medicine | Admitting: Family Medicine

## 2014-08-21 DIAGNOSIS — Z1231 Encounter for screening mammogram for malignant neoplasm of breast: Secondary | ICD-10-CM | POA: Diagnosis not present

## 2016-06-03 ENCOUNTER — Other Ambulatory Visit (HOSPITAL_COMMUNITY): Payer: Self-pay | Admitting: Family Medicine

## 2016-06-03 DIAGNOSIS — Z1231 Encounter for screening mammogram for malignant neoplasm of breast: Secondary | ICD-10-CM

## 2016-06-16 ENCOUNTER — Ambulatory Visit (HOSPITAL_COMMUNITY)
Admission: RE | Admit: 2016-06-16 | Discharge: 2016-06-16 | Disposition: A | Discharge status: Home / Self Care | Payer: Medicare HMO | Source: Ambulatory Visit | Attending: Family Medicine | Admitting: Family Medicine

## 2016-06-16 DIAGNOSIS — Z1231 Encounter for screening mammogram for malignant neoplasm of breast: Secondary | ICD-10-CM | POA: Insufficient documentation

## 2016-07-18 ENCOUNTER — Ambulatory Visit (HOSPITAL_COMMUNITY)
Admission: RE | Admit: 2016-07-18 | Discharge: 2016-07-18 | Disposition: A | Discharge status: Home / Self Care | Payer: Medicare HMO | Source: Ambulatory Visit | Attending: Family Medicine | Admitting: Family Medicine

## 2016-07-18 ENCOUNTER — Other Ambulatory Visit (HOSPITAL_COMMUNITY): Payer: Self-pay | Admitting: Family Medicine

## 2016-07-18 DIAGNOSIS — M50322 Other cervical disc degeneration at C5-C6 level: Secondary | ICD-10-CM | POA: Insufficient documentation

## 2016-07-18 DIAGNOSIS — R52 Pain, unspecified: Secondary | ICD-10-CM

## 2016-07-18 DIAGNOSIS — M25511 Pain in right shoulder: Secondary | ICD-10-CM | POA: Insufficient documentation

## 2016-10-07 ENCOUNTER — Other Ambulatory Visit (HOSPITAL_COMMUNITY): Payer: Self-pay | Admitting: Family Medicine

## 2016-10-07 DIAGNOSIS — I749 Embolism and thrombosis of unspecified artery: Secondary | ICD-10-CM

## 2016-10-11 ENCOUNTER — Ambulatory Visit (HOSPITAL_COMMUNITY)
Admission: RE | Admit: 2016-10-11 | Discharge: 2016-10-11 | Disposition: A | Discharge status: Home / Self Care | Payer: Medicare HMO | Source: Ambulatory Visit | Attending: Family Medicine | Admitting: Family Medicine

## 2016-10-17 ENCOUNTER — Ambulatory Visit (HOSPITAL_COMMUNITY)
Admission: RE | Admit: 2016-10-17 | Discharge: 2016-10-17 | Disposition: A | Discharge status: Home / Self Care | Payer: Medicare HMO | Source: Ambulatory Visit | Attending: Family Medicine | Admitting: Family Medicine

## 2016-10-17 DIAGNOSIS — I749 Embolism and thrombosis of unspecified artery: Secondary | ICD-10-CM | POA: Insufficient documentation

## 2017-09-19 ENCOUNTER — Other Ambulatory Visit (HOSPITAL_COMMUNITY): Payer: Self-pay | Admitting: Family Medicine

## 2017-09-19 DIAGNOSIS — Z1231 Encounter for screening mammogram for malignant neoplasm of breast: Secondary | ICD-10-CM

## 2017-09-27 ENCOUNTER — Ambulatory Visit (HOSPITAL_COMMUNITY)
Admission: RE | Admit: 2017-09-27 | Discharge: 2017-09-27 | Disposition: A | Discharge status: Home / Self Care | Payer: Medicare HMO | Source: Ambulatory Visit | Attending: Family Medicine | Admitting: Family Medicine

## 2017-09-27 DIAGNOSIS — Z1231 Encounter for screening mammogram for malignant neoplasm of breast: Secondary | ICD-10-CM | POA: Insufficient documentation

## 2018-09-29 ENCOUNTER — Inpatient Hospital Stay (HOSPITAL_COMMUNITY): Payer: Medicare HMO

## 2018-09-29 ENCOUNTER — Inpatient Hospital Stay (HOSPITAL_COMMUNITY)
Admission: EM | Admit: 2018-09-29 | Discharge: 2018-10-02 | DRG: 853 | Disposition: E | Payer: Medicare HMO | Attending: General Surgery | Admitting: General Surgery

## 2018-09-29 ENCOUNTER — Other Ambulatory Visit: Payer: Self-pay

## 2018-09-29 ENCOUNTER — Emergency Department (HOSPITAL_COMMUNITY): Payer: Medicare HMO | Admitting: Anesthesiology

## 2018-09-29 ENCOUNTER — Emergency Department (HOSPITAL_COMMUNITY): Payer: Medicare HMO

## 2018-09-29 ENCOUNTER — Encounter (HOSPITAL_COMMUNITY): Payer: Self-pay | Admitting: *Deleted

## 2018-09-29 ENCOUNTER — Encounter (HOSPITAL_COMMUNITY): Admission: EM | Disposition: E | Payer: Self-pay | Source: Home / Self Care | Attending: General Surgery

## 2018-09-29 DIAGNOSIS — E872 Acidosis: Secondary | ICD-10-CM | POA: Diagnosis present

## 2018-09-29 DIAGNOSIS — Z7982 Long term (current) use of aspirin: Secondary | ICD-10-CM | POA: Diagnosis not present

## 2018-09-29 DIAGNOSIS — N179 Acute kidney failure, unspecified: Secondary | ICD-10-CM | POA: Diagnosis present

## 2018-09-29 DIAGNOSIS — Z90710 Acquired absence of both cervix and uterus: Secondary | ICD-10-CM

## 2018-09-29 DIAGNOSIS — R6521 Severe sepsis with septic shock: Secondary | ICD-10-CM | POA: Diagnosis present

## 2018-09-29 DIAGNOSIS — Z79890 Hormone replacement therapy (postmenopausal): Secondary | ICD-10-CM | POA: Diagnosis not present

## 2018-09-29 DIAGNOSIS — K658 Other peritonitis: Secondary | ICD-10-CM | POA: Diagnosis present

## 2018-09-29 DIAGNOSIS — E119 Type 2 diabetes mellitus without complications: Secondary | ICD-10-CM | POA: Diagnosis present

## 2018-09-29 DIAGNOSIS — I10 Essential (primary) hypertension: Secondary | ICD-10-CM | POA: Diagnosis present

## 2018-09-29 DIAGNOSIS — I4891 Unspecified atrial fibrillation: Secondary | ICD-10-CM | POA: Diagnosis not present

## 2018-09-29 DIAGNOSIS — A419 Sepsis, unspecified organism: Secondary | ICD-10-CM | POA: Diagnosis present

## 2018-09-29 DIAGNOSIS — E039 Hypothyroidism, unspecified: Secondary | ICD-10-CM | POA: Diagnosis present

## 2018-09-29 DIAGNOSIS — K5641 Fecal impaction: Secondary | ICD-10-CM

## 2018-09-29 DIAGNOSIS — Z7984 Long term (current) use of oral hypoglycemic drugs: Secondary | ICD-10-CM

## 2018-09-29 DIAGNOSIS — Z452 Encounter for adjustment and management of vascular access device: Secondary | ICD-10-CM

## 2018-09-29 DIAGNOSIS — Z79899 Other long term (current) drug therapy: Secondary | ICD-10-CM | POA: Diagnosis not present

## 2018-09-29 DIAGNOSIS — K631 Perforation of intestine (nontraumatic): Secondary | ICD-10-CM

## 2018-09-29 DIAGNOSIS — Z20828 Contact with and (suspected) exposure to other viral communicable diseases: Secondary | ICD-10-CM | POA: Diagnosis present

## 2018-09-29 HISTORY — PX: COLECTOMY WITH COLOSTOMY CREATION/HARTMANN PROCEDURE: SHX6598

## 2018-09-29 HISTORY — PX: CENTRAL LINE INSERTION: CATH118232

## 2018-09-29 HISTORY — DX: Unspecified glaucoma: H40.9

## 2018-09-29 HISTORY — DX: Blindness, one eye, unspecified eye: H54.40

## 2018-09-29 LAB — COMPREHENSIVE METABOLIC PANEL
ALT: 42 U/L (ref 0–44)
AST: 70 U/L — ABNORMAL HIGH (ref 15–41)
Albumin: 3.4 g/dL — ABNORMAL LOW (ref 3.5–5.0)
Alkaline Phosphatase: 87 U/L (ref 38–126)
Anion gap: 16 — ABNORMAL HIGH (ref 5–15)
BUN: 31 mg/dL — ABNORMAL HIGH (ref 8–23)
CO2: 20 mmol/L — ABNORMAL LOW (ref 22–32)
Calcium: 10.8 mg/dL — ABNORMAL HIGH (ref 8.9–10.3)
Chloride: 103 mmol/L (ref 98–111)
Creatinine, Ser: 1.73 mg/dL — ABNORMAL HIGH (ref 0.44–1.00)
GFR calc Af Amer: 31 mL/min — ABNORMAL LOW (ref >60)
GFR calc non Af Amer: 26 mL/min — ABNORMAL LOW (ref >60)
Glucose, Bld: 248 mg/dL — ABNORMAL HIGH (ref 70–99)
Potassium: 4.6 mmol/L (ref 3.5–5.1)
Sodium: 139 mmol/L (ref 135–145)
Total Bilirubin: 1.2 mg/dL (ref 0.3–1.2)
Total Protein: 6.7 g/dL (ref 6.5–8.1)

## 2018-09-29 LAB — LACTIC ACID, PLASMA
Lactic Acid, Venous: 8.8 mmol/L (ref 0.5–1.9)
Lactic Acid, Venous: 8.4 mmol/L (ref 0.5–1.9)
Lactic Acid, Venous: 5.3 mmol/L (ref 0.5–1.9)
Lactic Acid, Venous: 5.6 mmol/L (ref 0.5–1.9)

## 2018-09-29 LAB — BLOOD GAS, ARTERIAL
Acid-base deficit: 9.8 mmol/L — ABNORMAL HIGH (ref 0.0–2.0)
Acid-base deficit: 14.1 mmol/L — ABNORMAL HIGH (ref 0.0–2.0)
Bicarbonate: 16.8 mmol/L — ABNORMAL LOW (ref 20.0–28.0)
Bicarbonate: 14.4 mmol/L — ABNORMAL LOW (ref 20.0–28.0)
FIO2: 100
FIO2: 60
O2 Saturation: 99 %
O2 Saturation: 98.2 %
Patient temperature: 37
Patient temperature: 37
pCO2 arterial: 31.9 mmHg — ABNORMAL LOW (ref 32.0–48.0)
pCO2 arterial: 18.9 mmHg — CL (ref 32.0–48.0)
pH, Arterial: 7.302 — ABNORMAL LOW (ref 7.350–7.450)
pH, Arterial: 7.361 (ref 7.350–7.450)
pO2, Arterial: 237 mmHg — ABNORMAL HIGH (ref 83.0–108.0)
pO2, Arterial: 133 mmHg — ABNORMAL HIGH (ref 83.0–108.0)

## 2018-09-29 LAB — CBC WITH DIFFERENTIAL/PLATELET
Abs Immature Granulocytes: 0.01 10*3/uL (ref 0.00–0.07)
Basophils Absolute: 0 10*3/uL (ref 0.0–0.1)
Basophils Relative: 1 %
Eosinophils Absolute: 0 10*3/uL (ref 0.0–0.5)
Eosinophils Relative: 0 %
HCT: 47.8 % — ABNORMAL HIGH (ref 36.0–46.0)
Hemoglobin: 15.1 g/dL — ABNORMAL HIGH (ref 12.0–15.0)
Immature Granulocytes: 0 %
Lymphocytes Relative: 17 %
Lymphs Abs: 0.6 10*3/uL — ABNORMAL LOW (ref 0.7–4.0)
MCH: 27.8 pg (ref 26.0–34.0)
MCHC: 31.6 g/dL (ref 30.0–36.0)
MCV: 87.9 fL (ref 80.0–100.0)
Monocytes Absolute: 0.1 10*3/uL (ref 0.1–1.0)
Monocytes Relative: 3 %
Neutro Abs: 2.6 10*3/uL (ref 1.7–7.7)
Neutrophils Relative %: 79 %
Platelets: 409 10*3/uL — ABNORMAL HIGH (ref 150–400)
RBC: 5.44 MIL/uL — ABNORMAL HIGH (ref 3.87–5.11)
RDW: 13.5 % (ref 11.5–15.5)
WBC: 3.4 10*3/uL — ABNORMAL LOW (ref 4.0–10.5)
nRBC: 0 % (ref 0.0–0.2)

## 2018-09-29 LAB — URINALYSIS, ROUTINE W REFLEX MICROSCOPIC
Bilirubin Urine: NEGATIVE
Glucose, UA: NEGATIVE mg/dL
Hgb urine dipstick: NEGATIVE
Ketones, ur: NEGATIVE mg/dL
Nitrite: NEGATIVE
Protein, ur: 30 mg/dL — AB
Specific Gravity, Urine: 1.021 (ref 1.005–1.030)
pH: 5 (ref 5.0–8.0)

## 2018-09-29 LAB — BASIC METABOLIC PANEL
Anion gap: 11 (ref 5–15)
BUN: 34 mg/dL — ABNORMAL HIGH (ref 8–23)
CO2: 14 mmol/L — ABNORMAL LOW (ref 22–32)
Calcium: 7.4 mg/dL — ABNORMAL LOW (ref 8.9–10.3)
Chloride: 114 mmol/L — ABNORMAL HIGH (ref 98–111)
Creatinine, Ser: 1.54 mg/dL — ABNORMAL HIGH (ref 0.44–1.00)
GFR calc Af Amer: 35 mL/min — ABNORMAL LOW (ref >60)
GFR calc non Af Amer: 30 mL/min — ABNORMAL LOW (ref >60)
Glucose, Bld: 132 mg/dL — ABNORMAL HIGH (ref 70–99)
Potassium: 3.9 mmol/L (ref 3.5–5.1)
Sodium: 139 mmol/L (ref 135–145)

## 2018-09-29 LAB — POC OCCULT BLOOD, ED: Fecal Occult Bld: POSITIVE — AB

## 2018-09-29 LAB — SARS CORONAVIRUS 2 BY RT PCR (HOSPITAL ORDER, PERFORMED IN ~~LOC~~ HOSPITAL LAB): SARS Coronavirus 2: NEGATIVE

## 2018-09-29 LAB — I-STAT CHEM 8, ED
BUN: 30 mg/dL — ABNORMAL HIGH (ref 8–23)
Calcium, Ion: 1.07 mmol/L — ABNORMAL LOW (ref 1.15–1.40)
Chloride: 109 mmol/L (ref 98–111)
Creatinine, Ser: 1.5 mg/dL — ABNORMAL HIGH (ref 0.44–1.00)
Glucose, Bld: 221 mg/dL — ABNORMAL HIGH (ref 70–99)
HCT: 42 % (ref 36.0–46.0)
Hemoglobin: 14.3 g/dL (ref 12.0–15.0)
Potassium: 4.3 mmol/L (ref 3.5–5.1)
Sodium: 135 mmol/L (ref 135–145)
TCO2: 13 mmol/L — ABNORMAL LOW (ref 22–32)

## 2018-09-29 LAB — PROTIME-INR
INR: 1.2 (ref 0.8–1.2)
Prothrombin Time: 15 seconds (ref 11.4–15.2)

## 2018-09-29 LAB — CBC
HCT: 36.9 % (ref 36.0–46.0)
Hemoglobin: 11.4 g/dL — ABNORMAL LOW (ref 12.0–15.0)
MCH: 27.3 pg (ref 26.0–34.0)
MCHC: 30.9 g/dL (ref 30.0–36.0)
MCV: 88.3 fL (ref 80.0–100.0)
Platelets: 247 10*3/uL (ref 150–400)
RBC: 4.18 MIL/uL (ref 3.87–5.11)
RDW: 13.9 % (ref 11.5–15.5)
WBC: 1.1 10*3/uL — CL (ref 4.0–10.5)
nRBC: 0 % (ref 0.0–0.2)

## 2018-09-29 LAB — GLUCOSE, CAPILLARY
Glucose-Capillary: 118 mg/dL — ABNORMAL HIGH (ref 70–99)
Glucose-Capillary: 105 mg/dL — ABNORMAL HIGH (ref 70–99)

## 2018-09-29 LAB — TROPONIN I (HIGH SENSITIVITY)
Troponin I (High Sensitivity): 8 ng/L (ref <18)
Troponin I (High Sensitivity): 8 ng/L (ref <18)

## 2018-09-29 LAB — MRSA PCR SCREENING: MRSA by PCR: NEGATIVE

## 2018-09-29 LAB — HEMOGLOBIN A1C
Hgb A1c MFr Bld: 6.2 % — ABNORMAL HIGH (ref 4.8–5.6)
Mean Plasma Glucose: 131.24 mg/dL

## 2018-09-29 LAB — LIPASE, BLOOD: Lipase: 32 U/L (ref 11–51)

## 2018-09-29 LAB — C-REACTIVE PROTEIN: CRP: 19.4 mg/dL — ABNORMAL HIGH (ref <1.0)

## 2018-09-29 SURGERY — COLECTOMY, WITH COLOSTOMY CREATION
Anesthesia: General | Site: Chest

## 2018-09-29 MED ORDER — NOREPINEPHRINE 4 MG/250ML-% IV SOLN
0.0000 ug/min | Freq: Once | INTRAVENOUS | Status: AC
  Administered 2018-09-29: 2 ug/min via INTRAVENOUS
  Filled 2018-09-29 (×2): qty 250

## 2018-09-29 MED ORDER — LIDOCAINE HCL (CARDIAC) PF 100 MG/5ML IV SOSY
PREFILLED_SYRINGE | INTRAVENOUS | Status: DC | PRN
  Administered 2018-09-29: 40 mg via INTRATRACHEAL

## 2018-09-29 MED ORDER — ETOMIDATE 2 MG/ML IV SOLN
INTRAVENOUS | Status: DC | PRN
  Administered 2018-09-29: 10 mg via INTRAVENOUS

## 2018-09-29 MED ORDER — LACTATED RINGERS IV SOLN
INTRAVENOUS | Status: DC | PRN
  Administered 2018-09-29 (×2): via INTRAVENOUS

## 2018-09-29 MED ORDER — PIPERACILLIN-TAZOBACTAM 3.375 G IVPB
3.3750 g | Freq: Three times a day (TID) | INTRAVENOUS | Status: DC
  Administered 2018-09-29 – 2018-09-30 (×3): 3.375 g via INTRAVENOUS
  Filled 2018-09-29 (×2): qty 50

## 2018-09-29 MED ORDER — PIPERACILLIN-TAZOBACTAM 3.375 G IVPB: 3.3750 g | Freq: Three times a day (TID) | INTRAVENOUS | Status: DC

## 2018-09-29 MED ORDER — SUCCINYLCHOLINE CHLORIDE 200 MG/10ML IV SOSY
PREFILLED_SYRINGE | INTRAVENOUS | Status: AC
  Filled 2018-09-29: qty 10

## 2018-09-29 MED ORDER — PHENYLEPHRINE HCL (PRESSORS) 10 MG/ML IV SOLN
INTRAVENOUS | Status: DC | PRN
  Administered 2018-09-29: 80 ug via INTRAVENOUS
  Administered 2018-09-29: 120 ug via INTRAVENOUS
  Administered 2018-09-29 (×4): 80 ug via INTRAVENOUS

## 2018-09-29 MED ORDER — ACETAMINOPHEN 325 MG PO TABS: 650.0000 mg | ORAL_TABLET | Freq: Four times a day (QID) | ORAL | Status: DC | PRN

## 2018-09-29 MED ORDER — SODIUM CHLORIDE 0.9 % IV BOLUS
1000.0000 mL | Freq: Once | INTRAVENOUS | Status: AC
  Administered 2018-09-29: 18:00:00 1000 mL via INTRAVENOUS

## 2018-09-29 MED ORDER — PIPERACILLIN-TAZOBACTAM 3.375 G IVPB 30 MIN
3.3750 g | Freq: Once | INTRAVENOUS | Status: AC
  Administered 2018-09-29: 3.375 g via INTRAVENOUS
  Filled 2018-09-29: qty 50

## 2018-09-29 MED ORDER — ETOMIDATE 2 MG/ML IV SOLN
INTRAVENOUS | Status: AC
  Filled 2018-09-29: qty 10

## 2018-09-29 MED ORDER — INSULIN ASPART 100 UNIT/ML ~~LOC~~ SOLN
0.0000 [IU] | Freq: Three times a day (TID) | SUBCUTANEOUS | Status: DC
  Filled 2018-09-29: qty 0.15

## 2018-09-29 MED ORDER — SODIUM CHLORIDE 0.9 % IV SOLN
100.0000 mg | INTRAVENOUS | Status: DC
  Filled 2018-09-29: qty 100

## 2018-09-29 MED ORDER — ENOXAPARIN SODIUM 40 MG/0.4ML ~~LOC~~ SOLN: 40.0000 mg | SUBCUTANEOUS | Status: DC

## 2018-09-29 MED ORDER — FENTANYL CITRATE (PF) 100 MCG/2ML IJ SOLN
50.0000 ug | INTRAMUSCULAR | Status: DC | PRN
  Administered 2018-09-29 – 2018-09-30 (×2): 50 ug via INTRAVENOUS
  Filled 2018-09-29 (×2): qty 2

## 2018-09-29 MED ORDER — ACETAMINOPHEN 650 MG RE SUPP
650.0000 mg | Freq: Four times a day (QID) | RECTAL | Status: DC | PRN
  Administered 2018-09-30: 650 mg via RECTAL
  Filled 2018-09-29: qty 1

## 2018-09-29 MED ORDER — NOREPINEPHRINE BITARTRATE 1 MG/ML IV SOLN
INTRAVENOUS | Status: DC | PRN
  Administered 2018-09-29: 6 ug/min via INTRAVENOUS

## 2018-09-29 MED ORDER — BRIMONIDINE TARTRATE 0.15 % OP SOLN
1.0000 [drp] | Freq: Two times a day (BID) | OPHTHALMIC | Status: DC
  Filled 2018-09-29: qty 5

## 2018-09-29 MED ORDER — ORAL CARE MOUTH RINSE
15.0000 mL | OROMUCOSAL | Status: DC
  Administered 2018-09-29 – 2018-09-30 (×5): 15 mL via OROMUCOSAL

## 2018-09-29 MED ORDER — FENTANYL CITRATE (PF) 100 MCG/2ML IJ SOLN
INTRAMUSCULAR | Status: DC | PRN
  Administered 2018-09-29 (×3): 50 ug via INTRAVENOUS
  Administered 2018-09-29 (×4): 25 ug via INTRAVENOUS

## 2018-09-29 MED ORDER — SODIUM CHLORIDE 0.9% FLUSH
10.0000 mL | Freq: Two times a day (BID) | INTRAVENOUS | Status: DC
  Administered 2018-09-29: 10 mL

## 2018-09-29 MED ORDER — CHLORHEXIDINE GLUCONATE CLOTH 2 % EX PADS
6.0000 | MEDICATED_PAD | Freq: Once | CUTANEOUS | Status: AC
  Administered 2018-09-29: 6 via TOPICAL

## 2018-09-29 MED ORDER — SODIUM CHLORIDE 0.9 % IV BOLUS
500.0000 mL | Freq: Once | INTRAVENOUS | Status: AC
  Administered 2018-09-29: 500 mL via INTRAVENOUS

## 2018-09-29 MED ORDER — SODIUM CHLORIDE 0.9 % IV SOLN: INTRAVENOUS | Status: DC

## 2018-09-29 MED ORDER — CHLORHEXIDINE GLUCONATE CLOTH 2 % EX PADS
6.0000 | MEDICATED_PAD | Freq: Every day | CUTANEOUS | Status: DC
  Administered 2018-09-29: 6 via TOPICAL

## 2018-09-29 MED ORDER — SODIUM CHLORIDE 0.9 % IR SOLN
Status: DC | PRN
  Administered 2018-09-29 (×5): 1000 mL
  Administered 2018-09-29: 1
  Administered 2018-09-29: 1000 mL

## 2018-09-29 MED ORDER — PHENYLEPHRINE 40 MCG/ML (10ML) SYRINGE FOR IV PUSH (FOR BLOOD PRESSURE SUPPORT)
PREFILLED_SYRINGE | INTRAVENOUS | Status: AC
  Filled 2018-09-29: qty 20

## 2018-09-29 MED ORDER — SODIUM CHLORIDE 0.9 % IV SOLN
INTRAVENOUS | Status: DC
  Administered 2018-09-29: 13:00:00 via INTRAVENOUS

## 2018-09-29 MED ORDER — SODIUM CHLORIDE 0.9 % IV SOLN
200.0000 mg | Freq: Once | INTRAVENOUS | Status: AC
  Administered 2018-09-30: 04:00:00 200 mg via INTRAVENOUS
  Filled 2018-09-29: qty 200

## 2018-09-29 MED ORDER — ONDANSETRON HCL 4 MG/2ML IJ SOLN: 4.0000 mg | Freq: Four times a day (QID) | INTRAMUSCULAR | Status: DC | PRN

## 2018-09-29 MED ORDER — VANCOMYCIN HCL 10 G IV SOLR
1500.0000 mg | Freq: Once | INTRAVENOUS | Status: AC
  Administered 2018-09-30: 1500 mg via INTRAVENOUS
  Filled 2018-09-29: qty 1500

## 2018-09-29 MED ORDER — LORAZEPAM 2 MG/ML IJ SOLN
1.0000 mg | INTRAMUSCULAR | Status: DC | PRN
  Administered 2018-09-29 – 2018-09-30 (×2): 1 mg via INTRAVENOUS
  Filled 2018-09-29 (×2): qty 1

## 2018-09-29 MED ORDER — NOREPINEPHRINE 4 MG/250ML-% IV SOLN
0.0000 ug/min | INTRAVENOUS | Status: DC
  Administered 2018-09-29: 8 ug/min via INTRAVENOUS
  Administered 2018-09-29: 26 ug/min via INTRAVENOUS
  Administered 2018-09-29: 42 ug/min via INTRAVENOUS
  Administered 2018-09-30: 56 ug/min via INTRAVENOUS
  Administered 2018-09-30: 34 ug/min via INTRAVENOUS
  Administered 2018-09-30: 60 ug/min via INTRAVENOUS
  Administered 2018-09-30: 35 ug/min via INTRAVENOUS
  Filled 2018-09-29 (×4): qty 250

## 2018-09-29 MED ORDER — SODIUM CHLORIDE 0.9 % IV BOLUS
1000.0000 mL | Freq: Once | INTRAVENOUS | Status: AC
  Administered 2018-09-29: 12:00:00 1000 mL via INTRAVENOUS

## 2018-09-29 MED ORDER — FENTANYL CITRATE (PF) 250 MCG/5ML IJ SOLN
INTRAMUSCULAR | Status: AC
  Filled 2018-09-29: qty 10

## 2018-09-29 MED ORDER — PANTOPRAZOLE SODIUM 40 MG IV SOLR
40.0000 mg | Freq: Every day | INTRAVENOUS | Status: DC
  Administered 2018-09-29: 40 mg via INTRAVENOUS
  Filled 2018-09-29: qty 40

## 2018-09-29 MED ORDER — IOHEXOL 300 MG/ML  SOLN: 100.0000 mL | Freq: Once | INTRAMUSCULAR | Status: DC | PRN

## 2018-09-29 MED ORDER — ROCURONIUM BROMIDE 100 MG/10ML IV SOLN
INTRAVENOUS | Status: DC | PRN
  Administered 2018-09-29: 50 mg via INTRAVENOUS

## 2018-09-29 MED ORDER — BUPIVACAINE LIPOSOME 1.3 % IJ SUSP
INTRAMUSCULAR | Status: AC
  Filled 2018-09-29: qty 20

## 2018-09-29 MED ORDER — FENTANYL CITRATE (PF) 250 MCG/5ML IJ SOLN
INTRAMUSCULAR | Status: AC
  Filled 2018-09-29: qty 5

## 2018-09-29 MED ORDER — SODIUM CHLORIDE 0.9 % IV SOLN: INTRAVENOUS | Status: DC | PRN

## 2018-09-29 MED ORDER — SUCCINYLCHOLINE CHLORIDE 20 MG/ML IJ SOLN
INTRAMUSCULAR | Status: DC | PRN
  Administered 2018-09-29: 100 mg via INTRAVENOUS

## 2018-09-29 MED ORDER — LATANOPROST 0.005 % OP SOLN
1.0000 [drp] | Freq: Every day | OPHTHALMIC | Status: DC
  Administered 2018-09-29: 1 [drp] via OPHTHALMIC
  Filled 2018-09-29 (×2): qty 2.5

## 2018-09-29 MED ORDER — SODIUM CHLORIDE 0.9 % IV SOLN
INTRAVENOUS | Status: DC
  Administered 2018-09-29 – 2018-09-30 (×3): via INTRAVENOUS

## 2018-09-29 MED ORDER — ONDANSETRON 4 MG PO TBDP: 4.0000 mg | ORAL_TABLET | Freq: Four times a day (QID) | ORAL | Status: DC | PRN

## 2018-09-29 MED ORDER — SODIUM CHLORIDE 0.9 % IV BOLUS
1000.0000 mL | Freq: Once | INTRAVENOUS | Status: AC
  Administered 2018-09-29: 1000 mL via INTRAVENOUS

## 2018-09-29 MED ORDER — ROCURONIUM BROMIDE 10 MG/ML (PF) SYRINGE
PREFILLED_SYRINGE | INTRAVENOUS | Status: AC
  Filled 2018-09-29: qty 10

## 2018-09-29 MED ORDER — CHLORHEXIDINE GLUCONATE CLOTH 2 % EX PADS: 6.0000 | MEDICATED_PAD | Freq: Once | CUTANEOUS | Status: DC

## 2018-09-29 MED ORDER — CHLORHEXIDINE GLUCONATE 0.12% ORAL RINSE (MEDLINE KIT)
15.0000 mL | Freq: Two times a day (BID) | OROMUCOSAL | Status: DC
  Administered 2018-09-29: 15 mL via OROMUCOSAL

## 2018-09-29 MED ORDER — SODIUM CHLORIDE 0.9% FLUSH: 10.0000 mL | INTRAVENOUS | Status: DC | PRN

## 2018-09-29 MED ORDER — DORZOLAMIDE HCL-TIMOLOL MAL 2-0.5 % OP SOLN
1.0000 [drp] | Freq: Two times a day (BID) | OPHTHALMIC | Status: DC
  Administered 2018-09-29: 1 [drp] via OPHTHALMIC
  Filled 2018-09-29 (×2): qty 10

## 2018-09-29 MED ORDER — LIDOCAINE 2% (20 MG/ML) 5 ML SYRINGE
INTRAMUSCULAR | Status: AC
  Filled 2018-09-29: qty 5

## 2018-09-29 SURGICAL SUPPLY — 75 items
APL PRP STRL LF DISP 70% ISPRP (MISCELLANEOUS) ×6
APPLIER CLIP 11 MED OPEN (CLIP)
APPLIER CLIP 13 LRG OPEN (CLIP)
APR CLP LRG 13 20 CLIP (CLIP)
APR CLP MED 11 20 MLT OPN (CLIP)
BARRIER SKIN 2 1/4 (WOUND CARE) ×3 IMPLANT
BARRIER SKIN 2 1/4INCH (WOUND CARE) ×1
BARRIER SKIN 2 3/4 (OSTOMY) IMPLANT
BARRIER SKIN 2 3/4 INCH (OSTOMY)
BARRIER SKIN OD1.75 2 1/4 FLNG (WOUND CARE) IMPLANT
BARRIER SKIN OD2.25 2 3/4 FLNG (OSTOMY) IMPLANT
BIOPATCH BLUE 3/4IN DISK W/1.5 (GAUZE/BANDAGES/DRESSINGS) ×2 IMPLANT
BRR SKN FLT 1.75X2.25 2 PC (WOUND CARE) ×2
BRR SKN FLT 2.75X2.25 2 PC (OSTOMY)
CANISTER WOUND CARE 500ML ATS (WOUND CARE) ×2 IMPLANT
CHLORAPREP W/TINT 26 (MISCELLANEOUS) ×8 IMPLANT
CLAMP POUCH DRAINAGE QUIET (OSTOMY) IMPLANT
CLIP APPLIE 11 MED OPEN (CLIP) IMPLANT
CLIP APPLIE 13 LRG OPEN (CLIP) IMPLANT
CLOTH BEACON ORANGE TIMEOUT ST (SAFETY) ×4 IMPLANT
COVER LIGHT HANDLE STERIS (MISCELLANEOUS) ×8 IMPLANT
DRAPE WARM FLUID 44X44 (DRAPES) ×4 IMPLANT
DRSG VAC ATS LRG SENSATRAC (GAUZE/BANDAGES/DRESSINGS) ×2 IMPLANT
ELECT BLADE 6 FLAT ULTRCLN (ELECTRODE) IMPLANT
ELECT REM PT RETURN 9FT ADLT (ELECTROSURGICAL) ×4
ELECTRODE REM PT RTRN 9FT ADLT (ELECTROSURGICAL) ×2 IMPLANT
FORMALIN 10 PREFIL 480ML (MISCELLANEOUS) IMPLANT
GAUZE SPONGE 4X4 12PLY STRL (GAUZE/BANDAGES/DRESSINGS) ×6 IMPLANT
GLOVE BIOGEL PI IND STRL 7.0 (GLOVE) ×2 IMPLANT
GLOVE BIOGEL PI INDICATOR 7.0 (GLOVE) ×2
GLOVE SURG SS PI 7.5 STRL IVOR (GLOVE) ×14 IMPLANT
GOWN STRL REUS W/TWL LRG LVL3 (GOWN DISPOSABLE) ×12 IMPLANT
HANDLE SUCTION POOLE (INSTRUMENTS) ×2 IMPLANT
INST SET MAJOR GENERAL (KITS) ×4 IMPLANT
IV CONNECTOR ONE LINK NDLESS (IV SETS) ×4 IMPLANT
KIT CV MULTI LUMEN 7FRX20CM (SET/KITS/TRAYS/PACK) ×2 IMPLANT
KIT TURNOVER KIT A (KITS) ×4 IMPLANT
LIGASURE IMPACT 36 18CM CVD LR (INSTRUMENTS) ×4 IMPLANT
MANIFOLD NEPTUNE II (INSTRUMENTS) ×4 IMPLANT
NDL HYPO 21X1.5 SAFETY (NEEDLE) ×2 IMPLANT
NEEDLE HYPO 21X1.5 SAFETY (NEEDLE) ×4 IMPLANT
NS IRRIG 1000ML POUR BTL (IV SOLUTION) ×18 IMPLANT
PACK ABDOMINAL MAJOR (CUSTOM PROCEDURE TRAY) ×4 IMPLANT
PAD ARMBOARD 7.5X6 YLW CONV (MISCELLANEOUS) ×4 IMPLANT
POUCH OSTOMY 2 3/4  H 3804 (WOUND CARE)
POUCH OSTOMY 2 3/4 H 3804 (WOUND CARE)
POUCH OSTOMY 2 PC DRNBL 2.25 (WOUND CARE) IMPLANT
POUCH OSTOMY 2 PC DRNBL 2.75 (WOUND CARE) IMPLANT
POUCH OSTOMY DRNBL 2 1/4 (WOUND CARE) ×4
RELOAD LINEAR CUT PROX 55 BLUE (ENDOMECHANICALS) IMPLANT
RELOAD PROXIMATE 75MM BLUE (ENDOMECHANICALS) IMPLANT
RELOAD STAPLE 55 3.8 BLU REG (ENDOMECHANICALS) IMPLANT
RELOAD STAPLE 75 3.8 BLU REG (ENDOMECHANICALS) IMPLANT
SET BASIN LINEN APH (SET/KITS/TRAYS/PACK) ×4 IMPLANT
SPONGE DRAIN TRACH 4X4 STRL 2S (GAUZE/BANDAGES/DRESSINGS) ×2 IMPLANT
SPONGE LAP 18X18 RF (DISPOSABLE) ×10 IMPLANT
STAPLER CUT CVD 40MM GREEN (STAPLE) ×2 IMPLANT
STAPLER GUN LINEAR PROX 60 (STAPLE) IMPLANT
STAPLER PROXIMATE 55 BLUE (STAPLE) IMPLANT
STAPLER PROXIMATE 75MM BLUE (STAPLE) ×2 IMPLANT
STAPLER VISISTAT (STAPLE) ×4 IMPLANT
SUCTION POOLE HANDLE (INSTRUMENTS) ×4
SUT CHROMIC 0 SH (SUTURE) ×6 IMPLANT
SUT CHROMIC 3 0 PS 2 (SUTURE) ×6 IMPLANT
SUT CHROMIC 3 0 SH 27 (SUTURE) ×4 IMPLANT
SUT NOVA NAB GS-26 0 60 (SUTURE) ×8 IMPLANT
SUT NOVAFIL NAB HGS22 2-0 30IN (SUTURE) ×4 IMPLANT
SUT PDS AB 0 CTX 60 (SUTURE) IMPLANT
SUT PDS AB CT VIOLET #0 27IN (SUTURE) IMPLANT
SUT PROLENE 2 0 SH 30 (SUTURE) ×2 IMPLANT
SUT SILK 2 0 (SUTURE)
SUT SILK 2-0 18XBRD TIE 12 (SUTURE) IMPLANT
SUT SILK 3 0 SH CR/8 (SUTURE) IMPLANT
SYRINGE 20CC LL (MISCELLANEOUS) ×4 IMPLANT
TRAY FOLEY CATH SILVER 16FR (SET/KITS/TRAYS/PACK) ×2 IMPLANT

## 2018-09-29 NOTE — Consult Note (Signed)
Reason for Consult: Acute abdomen Referring Physician: Dr. Tessie Eke is an 85 y.o. female.  HPI: Patient is an 85 year old black female who presented to the emergency room with worsening abdominal pain and distention.  CT scan of the abdomen reveals a probable perforated sigmoid colon with stool outside the lumen.  Free air is also noted.  Patient states that any movement causes her abdomen to hurt.  This started less than 24 hours ago.  Past Medical History:  Diagnosis Date  . Blind one eye   . Diabetes mellitus   . Glaucoma   . Hypertension   . Hypothyroidism     Past Surgical History:  Procedure Laterality Date  . ABDOMINAL HYSTERECTOMY    . APPENDECTOMY    . CHOLECYSTECTOMY    . EYE SURGERY Bilateral    cataractsw/IOL  . THYROID SURGERY    . TONSILLECTOMY    . TRABECULECTOMY Left 08/22/2012   Procedure: TRABECULECTOMY;  Surgeon: Marylynn Pearson, MD;  Location: [facility];  Service: Ophthalmology;  Laterality: Left;    No family history on file.  Social History:  reports that she has never smoked. She has never used smokeless tobacco. She reports that she does not drink alcohol or use drugs.  Allergies:  Allergies  Allergen Reactions  . Hydrocodone Rash    Medications: I have reviewed the patient's current medications.  Results for orders placed or performed during the hospital encounter of 10-10-18 (from the past 48 hour(s))  POC occult blood, ED     Status: Abnormal   Collection Time: 2018/10/10  8:36 AM  Result Value Ref Range   Fecal Occult Bld POSITIVE (A) NEGATIVE  Comprehensive metabolic panel     Status: Abnormal   Collection Time: 10-10-2018  8:58 AM  Result Value Ref Range   Sodium 139 135 - 145 mmol/L   Potassium 4.6 3.5 - 5.1 mmol/L   Chloride 103 98 - 111 mmol/L   CO2 20 (L) 22 - 32 mmol/L   Glucose, Bld 248 (H) 70 - 99 mg/dL   BUN 31 (H) 8 - 23 mg/dL   Creatinine, Ser 1.73 (H) 0.44 - 1.00 mg/dL   Calcium 10.8 (H) 8.9 - 10.3 mg/dL   Total  Protein 6.7 6.5 - 8.1 g/dL   Albumin 3.4 (L) 3.5 - 5.0 g/dL   AST 70 (H) 15 - 41 U/L   ALT 42 0 - 44 U/L   Alkaline Phosphatase 87 38 - 126 U/L   Total Bilirubin 1.2 0.3 - 1.2 mg/dL   GFR calc non Af Amer 26 (L) >60 mL/min   GFR calc Af Amer 31 (L) >60 mL/min   Anion gap 16 (H) 5 - 15    Comment: Performed at [facility], [address] [place], [place] [address]  Lipase, blood     Status: None   Collection Time: Oct 10, 2018  8:58 AM  Result Value Ref Range   Lipase 32 11 - 51 U/L    Comment: Performed at [facility], [address] [place], [place] [address]  Troponin I (High Sensitivity)     Status: None   Collection Time: 2018-10-10  8:58 AM  Result Value Ref Range   Troponin I (High Sensitivity) 8 <18 ng/L    Comment: (NOTE) Elevated high sensitivity troponin I (hsTnI) values and significant  changes across serial measurements may suggest ACS but many other  chronic and acute conditions are known to elevate hsTnI results.  Refer to the "Links" section  for chest pain algorithms and additional  guidance. Performed at [facility], [address] [place], [place] [address]   CBC with Differential     Status: Abnormal   Collection Time: October 05, 2018  8:58 AM  Result Value Ref Range   WBC 3.4 (L) 4.0 - 10.5 K/uL   RBC 5.44 (H) 3.87 - 5.11 MIL/uL   Hemoglobin 15.1 (H) 12.0 - 15.0 g/dL   HCT 27.0 (H) 78.6 - 75.4 %   MCV 87.9 80.0 - 100.0 fL   MCH 27.8 26.0 - 34.0 pg   MCHC 31.6 30.0 - 36.0 g/dL   RDW 49.2 01.0 - 07.1 %   Platelets 409 (H) 150 - 400 K/uL   nRBC 0.0 0.0 - 0.2 %   Neutrophils Relative % 79 %   Neutro Abs 2.6 1.7 - 7.7 K/uL   Lymphocytes Relative 17 %   Lymphs Abs 0.6 (L) 0.7 - 4.0 K/uL   Monocytes Relative 3 %   Monocytes Absolute 0.1 0.1 - 1.0 K/uL   Eosinophils Relative 0 %   Eosinophils Absolute 0.0 0.0 - 0.5 K/uL   Basophils Relative 1 %   Basophils Absolute 0.0 0.0 - 0.1 K/uL   WBC Morphology MILD LEFT SHIFT (1-5% METAS, OCC MYELO, OCC BANDS)     Comment:  VACUOLATED NEUTROPHILS   Immature Granulocytes 0 %   Abs Immature Granulocytes 0.01 0.00 - 0.07 K/uL   Abnormal Lymphocytes Present PRESENT     Comment: Performed at [facility], [address], [place], [place] [address]  Protime-INR     Status: None   Collection Time: 10/05/18  8:58 AM  Result Value Ref Range   Prothrombin Time 15.0 11.4 - 15.2 seconds   INR 1.2 0.8 - 1.2    Comment: (NOTE) INR goal varies based on device and disease states. Performed at [facility], [address], [place], [place] [address]   Lactic acid, plasma     Status: Abnormal   Collection Time: 10/05/18  9:00 AM  Result Value Ref Range   Lactic Acid, Venous 8.8 (HH) 0.5 - 1.9 mmol/L    Comment: CRITICAL RESULT CALLED TO, READ BACK BY AND VERIFIED WITH: MENTOR @ 0942 ON [id number] BY HENDERSON L. Performed at [facility], [address], [place], [place] [address]   Lactic acid, plasma     Status: Abnormal   Collection Time: Oct 05, 2018 10:54 AM  Result Value Ref Range   Lactic Acid, Venous 8.4 (HH) 0.5 - 1.9 mmol/L    Comment: CRITICAL RESULT CALLED TO, READ BACK BY AND VERIFIED WITH: ALSTON @ 1120 ON [id number] BY HENDERSON L Performed at [facility], [address], [place], [place] [address]   Troponin I (High Sensitivity)     Status: None   Collection Time: 10-05-2018 10:54 AM  Result Value Ref Range   Troponin I (High Sensitivity) 8 <18 ng/L    Comment: (NOTE) Elevated high sensitivity troponin I (hsTnI) values and significant  changes across serial measurements may suggest ACS but many other  chronic and acute conditions are known to elevate hsTnI results.  Refer to the "Links" section for chest pain algorithms and additional  guidance. Performed at [facility], [address], [place], [place] [address]     Ct Abdomen Pelvis Wo Contrast  Result Date: 05-Oct-2018 CLINICAL DATA:  Abdominal pain and distension. EXAM: CT ABDOMEN AND PELVIS WITHOUT CONTRAST TECHNIQUE: Multidetector CT imaging of the  abdomen and pelvis was performed following the standard protocol without IV  contrast. COMPARISON:  None. FINDINGS: Lower chest: No acute abnormality. Hepatobiliary: No focal liver abnormality is seen. Status post cholecystectomy. No biliary dilatation. Pancreas: Unremarkable. No pancreatic ductal dilatation or surrounding inflammatory changes. Spleen: Normal in size without focal abnormality. Adrenals/Urinary Tract: Adrenal glands are unremarkable. Kidneys demonstrate no hydronephrosis. Probable mild renal atrophy bilaterally. Anterior right renal cyst measures 4.7 cm with simple cystic internal density. Bladder is decompressed. Stomach/Bowel: There is evidence of bowel perforation with moderate scattered free intraperitoneal air throughout the peritoneal cavity. There is associated scattered free fluid throughout the peritoneal cavity. There appears to be some extraluminal stool in the left lower quadrant/left anterior pelvis which would be consistent with probable perforation at the level of the proximal sigmoid colon. There also is moderate fecal material in the rectum with probable relative rectal impaction. No significant diverticular disease. No associated small bowel obstruction. The stomach does appear potentially thickened but is under distended. Vascular/Lymphatic: No significant vascular findings are present. No enlarged abdominal or pelvic lymph nodes. Reproductive: Status post hysterectomy. No adnexal masses. Other: No hernias identified. Musculoskeletal: Degenerative disc disease of the lower lumbar spine. IMPRESSION: Evidence of bowel perforation with moderate scattered free air throughout the peritoneal cavity as well as scattered free fluid. There does appear to be some extraluminal stool-like material in the left lower pelvis anteriorly which may be consistent with a perforation at the level of the sigmoid colon. There is also associated likely relative rectal fecal impaction. No associated small  bowel obstruction. These results were called by telephone at the time of interpretation on 09/28/2018 at 11:13 am to Dr. Samuel JesterKATHLEEN MCMANUS , who verbally acknowledged these results. Electronically Signed   By: Irish LackGlenn  Yamagata M.D.   On: 09/27/2018 11:21   Dg Chest 2 View  Result Date: 09/05/2018 CLINICAL DATA:  Generalized abdominal pain.  Constipation. EXAM: CHEST - 2 VIEW COMPARISON:  August 16, 2012 FINDINGS: The heart size and mediastinal contours are within normal limits. Both lungs are clear. The visualized skeletal structures are unremarkable. IMPRESSION: No active cardiopulmonary disease. Electronically Signed   By: Gerome Samavid  Williams III M.D   On: 09/27/2018 10:54    ROS:  Pertinent items are noted in HPI.  Blood pressure 102/71, pulse 99, temperature 99.9 F (37.7 C), temperature source Oral, resp. rate (!) 22, height 5\' 3"  (1.6 m), weight 68 kg, SpO2 98 %. Physical Exam: Pleasant black female in moderate distress due to pain. Head is normocephalic, atraumatic Lungs are clear to auscultation with equal breath sounds bilaterally Heart examination reveals regular rate and rhythm without S3, S4, murmurs Abdomen is distended with rigidity.  A lower midline surgical scar is present.  Her abdomen is diffusely tender.  No bowel sounds appreciated.  CT scan images personally reviewed  Assessment/Plan: Impression: Acute abdomen with peritonitis, probable fecal peritonitis from colon perforation, hypotension, acute renal failure Plan: Patient will be taken emergently to the operating room pending her COVID test for exploratory laparotomy, probable partial colectomy with colostomy.  The risks and benefits of the procedure including bleeding, infection, cardiopulmonary difficulties, death, postoperative intubation, and a colostomy were fully explained to the patient and family, who gave informed consent.  Patient is being resuscitated at the present time.  Franky MachoMark Sallie Staron 09/24/2018, 12:01 PM

## 2018-09-29 NOTE — Progress Notes (Signed)
CRITICAL VALUE ALERT  Critical Value:  Lactic acid- 5.2/ WBC 1.1  Date & Time Notied:  09/10/2018 @ 2040  Provider Notified: Dr. Kennon Holter  Orders Received/Actions taken: Waiting for call back/orders

## 2018-09-29 NOTE — Addendum Note (Signed)
Addendum  created 10-14-18 1833 by Vista Deck, CRNA   Charge Capture section accepted

## 2018-09-29 NOTE — Anesthesia Procedure Notes (Signed)
Procedure Name: Intubation Date/Time: 09/25/2018 2:41 PM Performed by: Denese Killings, MD Pre-anesthesia Checklist: Patient identified, Emergency Drugs available, Suction available, Patient being monitored and Timeout performed Patient Re-evaluated:Patient Re-evaluated prior to induction Oxygen Delivery Method: Circle system utilized Preoxygenation: Pre-oxygenation with 100% oxygen Induction Type: IV induction, Rapid sequence and Cricoid Pressure applied Grade View: Grade I Tube type: Oral Tube size: 7.0 mm Number of attempts: 1 Placement Confirmation: ETT inserted through vocal cords under direct vision,  positive ETCO2 and breath sounds checked- equal and bilateral Secured at: 20 cm Tube secured with: Tape Comments: Loose lower  Front tooth was noticed during intubation, tooth intact after intubation

## 2018-09-29 NOTE — Progress Notes (Signed)
ANTIBIOTIC CONSULT NOTE-Preliminary  Pharmacy Consult for Vancomycin Indication: Sepsis d/t intra-abdominal source  Allergies  Allergen Reactions  . Hydrocodone Rash    Patient Measurements: Height: 5\' 3"  (160 cm) Weight: 170 lb 3.1 oz (77.2 kg) IBW/kg (Calculated) : 52.4   Vital Signs: Temp: 98.8 F (37.1 C) (08/29 2200) Temp Source: Rectal (08/29 2200) BP: 67/29 (08/29 2345) Pulse Rate: 119 (08/29 2315)  Labs: Recent Labs    09/05/2018 0858 09/16/2018 1020 09/10/2018 2006  WBC 3.4*  --  1.1*  HGB 15.1* 14.3 11.4*  PLT 409*  --  247  CREATININE 1.73* 1.50* 1.54*    Estimated Creatinine Clearance: 26.3 mL/min (A) (by C-G formula based on SCr of 1.54 mg/dL (H)).  No results for input(s): VANCOTROUGH, VANCOPEAK, VANCORANDOM, GENTTROUGH, GENTPEAK, GENTRANDOM, TOBRATROUGH, TOBRAPEAK, TOBRARND, AMIKACINPEAK, AMIKACINTROU, AMIKACIN in the last 72 hours.   Microbiology: Recent Results (from the past 720 hour(s))  SARS Coronavirus 2 [facility] order, Performed in [facility] hospital lab) Nasopharyngeal Nasopharyngeal Swab     Status: None   Collection Time: 09/06/2018 11:15 AM   Specimen: Nasopharyngeal Swab  Result Value Ref Range Status   SARS Coronavirus 2 NEGATIVE NEGATIVE Final    Comment: (NOTE) If result is NEGATIVE SARS-CoV-2 target nucleic acids are NOT DETECTED. The SARS-CoV-2 RNA is generally detectable in upper and lower  respiratory specimens during the acute phase of infection. The lowest  concentration of SARS-CoV-2 viral copies this assay can detect is 250  copies / mL. A negative result does not preclude SARS-CoV-2 infection  and should not be used as the sole basis for treatment or other  patient management decisions.  A negative result may occur with  improper specimen collection / handling, submission of specimen other  than nasopharyngeal swab, presence of viral mutation(s) within the  areas targeted by this assay, and inadequate number of viral copies   (<250 copies / mL). A negative result must be combined with clinical  observations, patient history, and epidemiological information. If result is POSITIVE SARS-CoV-2 target nucleic acids are DETECTED. The SARS-CoV-2 RNA is generally detectable in upper and lower  respiratory specimens dur ing the acute phase of infection.  Positive  results are indicative of active infection with SARS-CoV-2.  Clinical  correlation with patient history and other diagnostic information is  necessary to determine patient infection status.  Positive results do  not rule out bacterial infection or co-infection with other viruses. If result is PRESUMPTIVE POSTIVE SARS-CoV-2 nucleic acids MAY BE PRESENT.   A presumptive positive result was obtained on the submitted specimen  and confirmed on repeat testing.  While 2019 novel coronavirus  (SARS-CoV-2) nucleic acids may be present in the submitted sample  additional confirmatory testing may be necessary for epidemiological  and / or clinical management purposes  to differentiate between  SARS-CoV-2 and other Sarbecovirus currently known to infect humans.  If clinically indicated additional testing with an alternate test  methodology [phone]) is advised. The SARS-CoV-2 RNA is generally  detectable in upper and lower respiratory sp ecimens during the acute  phase of infection. The expected result is Negative. Fact Sheet for Patients:  StrictlyIdeas.no Fact Sheet for Healthcare Providers: BankingDealers.co.za This test is not yet approved or cleared by the [place] FDA and has been authorized for detection and/or diagnosis of SARS-CoV-2 by FDA under an Emergency Use Authorization (EUA).  This EUA will remain in effect (meaning this test can be used) for the duration of the COVID-19 declaration under Section 564(b)(1) of  the Act, 21 U.S.C. section 360bbb-3(b)(1), unless the authorization is terminated  or revoked sooner. Performed at [facility], [address] [place], [place] [address]   MRSA PCR Screening     Status: None   Collection Time: 2018/02/12  6:55 PM   Specimen: Nasopharyngeal  Result Value Ref Range Status   MRSA by PCR NEGATIVE NEGATIVE Final    Comment:        The GeneXpert MRSA Assay (FDA approved for NASAL specimens only), is one component of a comprehensive MRSA colonization surveillance program. It is not intended to diagnose MRSA infection nor to guide or monitor treatment for MRSA infections. Performed at [facility], [address] [place], [place] [address]     Medical History: Past Medical History:  Diagnosis Date  . Blind one eye   . Diabetes mellitus   . Glaucoma   . Hypertension   . Hypothyroidism     Medications:  Zosyn 3.375g IV q8h   8/29 >> Anidulafungin 200mg  IV x 1 then 100mg  IV q24h  8/30 >>  Assessment: 85 yo F admitted 8/29 with perforated colon and fecal peritonitis. S/P colectomy with colostomy surgery today. Patient now demonstrating symptoms of septic shock. Vanc initiated to broaden antibiotic coverage.  Goal of Therapy:  Vanc trough 15-20 or based on AUC if determined to be necessary  Plan: Vancomycin 1500mg  IV x 1 (~ 20mg /kg) Preliminary review of pertinent patient information completed.  Protocol will be initiated with dose(s) of Vancomycin  [name] HawkingAnnie Penn clinical pharmacist will complete review during morning rounds to assess patient and finalize treatment regimen if needed.  Claybon Jabsngel, Margerite Impastato G, RPH 09/25/2018,12:00 AM

## 2018-09-29 NOTE — Anesthesia Preprocedure Evaluation (Addendum)
Anesthesia Evaluation  Patient identified by MRN, date of birth, ID band Patient awake    Reviewed: Allergy & Precautions, NPO status , Patient's Chart, lab work & pertinent test results  Airway Mallampati: II  TM Distance: >3 FB     Dental  (+) Edentulous Upper, Missing,    Pulmonary    breath sounds clear to auscultation       Cardiovascular METS: 3 - Mets hypertension,  Rate:Normal  October 24, 2018 08:22:27 [facility] ROUTINE RECORD Sinus tachycardia Abnormal R-wave progression, early transition Nonspecific T abnormalities, diffuse leads When compared with ECG of 08/16/2012 Rate faster Nonspecific ST and T wave abnormality is now Present Confirmed by Francine Graven [id number]) on 2018-10-24 9:09:19 AM   Neuro/Psych negative neurological ROS  negative psych ROS   GI/Hepatic Perforated bowel   Endo/Other  diabetes, Type 2Hypothyroidism   Renal/GU Renal InsufficiencyRenal disease     Musculoskeletal   Abdominal   Peds  Hematology   Anesthesia Other Findings Patient on levophed infusion 6 mcg/minute  Reproductive/Obstetrics                            Anesthesia Physical Anesthesia Plan  ASA: IV and emergent  Anesthesia Plan: General   Post-op Pain Management:    Induction: Intravenous and Cricoid pressure planned  PONV Risk Score and Plan:   Airway Management Planned: Oral ETT  Additional Equipment: Arterial line and CVP  Intra-op Plan:   Post-operative Plan: Post-operative intubation/ventilation and Possible Post-op intubation/ventilation  Informed Consent: I have reviewed the patients History and Physical, chart, labs and discussed the procedure including the risks, benefits and alternatives for the proposed anesthesia with the patient or authorized representative who has indicated his/her understanding and acceptance.     Dental advisory given and Consent  reviewed with POA  Plan Discussed with:   Anesthesia Plan Comments: (Patient on levophed infusion 6 mcg/minute. )       Anesthesia Quick Evaluation

## 2018-09-29 NOTE — Progress Notes (Signed)
CRITICAL VALUE ALERT  Critical Value:  PCO2- 18.9  Date & Time Notied:  10/01/2018 @ 2330  Provider Notified: Gregary Signs, RN with Warren Lacy  Orders Received/Actions taken: n/a

## 2018-09-29 NOTE — ED Notes (Signed)
CRITICAL VALUE ALERT  Critical Value:  Lactic Acid 8.4  Date & Time Notied:  09/26/2018 1125  Provider Notified: Dr. Thurnell Garbe  Orders Received/Actions taken: EDP notified

## 2018-09-29 NOTE — Progress Notes (Signed)
Dr. Arnoldo Morale made aware at 1900 of patients blood pressures, increasing of Levophed, ABG results and at 1745 rectal temp of 93.6 (at time of admission).

## 2018-09-29 NOTE — Anesthesia Procedure Notes (Signed)
Arterial Line Insertion Start/End2020/09/26 2:34 PM, October 27, 2018 2:37 PM Performed by: Denese Killings, MD, anesthesiologist  Patient location: OR. Preanesthetic checklist: patient identified, IV checked, risks and benefits discussed, surgical consent, monitors and equipment checked, pre-op evaluation and timeout performed Right, radial was placed Catheter size: 20 G Hand hygiene performed  and maximum sterile barriers used   Attempts: 1 Procedure performed without using ultrasound guided technique. Following insertion, dressing applied. Post procedure assessment: normal  Patient tolerated the procedure well with no immediate complications.

## 2018-09-29 NOTE — Progress Notes (Signed)
[facility] Progress Note Patient Name: [name] [name] DOB: 01-Apr-1931 MRN: [id number]   Date of Service  09/01/2018  HPI/Events of Note  Hypotension - BP = 73/55 with MAP = 63. Currently on Norepinephrine IV infusion at 40 mcg/min. Hgb = 11.4.  eICU Interventions  Will order: 1. Monitor CVP now and Q 4 hours. 2. ABG STAT.  3. Increase ceiling on Norepinephrine IV infusion to 60 mcg/min.     Intervention Category Major Interventions: Hypotension - evaluation and management  Sommer,Steven Eugene 09/18/2018, 10:25 PM

## 2018-09-29 NOTE — Op Note (Signed)
Patient:  [name] [name]  DOB:  10/28/1931  MRN:  [id number]   Preop Diagnosis: Perforated viscus, peritonitis  Postop Diagnosis: Same, perforated sigmoid colon, fecal peritonitis  Procedure: Exploratory laparotomy, sigmoid colectomy with colostomy (Hartman's procedure), central line placement  Surgeon: Aviva Signs, MD  Anes: General endotracheal  Indications: Patient is an 86 year old black female who presented to the emergency room with hypotension and diffuse abdominal pain.  CT scan of the abdomen reveals free air under the diaphragm with probable extraluminal stool within the abdomen.  Patient has peritonitis with sepsis.  Patient is being brought emergently to the operating room for an exploratory laparotomy, probable colectomy with colostomy.  The risks and benefits of the procedure including bleeding, infection, pneumothorax, cardiopulmonary difficulties, death, and the possibility of a colostomy were fully explained to the patient, who gave informed consent.  Procedure note: The patient was placed in the supine position.  After induction of general endotracheal anesthesia, a left subclavian vein triple-lumen catheter was placed using the Seldinger technique without difficulty.  Good backflow of venous blood was noted on aspiration of all 3 ports.  The catheter was secured at the 18 cm Ariele Vidrio.  A dry sterile dressing was applied.  The catheter was immediately used for IV fluids and Levophed.  The abdomen was then prepped and draped using usual sterile technique with ChloraPrep.  Surgical site confirmation was performed.  The midline incision was made from above the umbilicus through the remnant of the umbilicus inferiorly.  Peritoneal cavity was entered into without difficulty.  A small serosal tear was noted from a single loop of small bowel and this was repaired with 3-0 silk sutures.  Stool was noted throughout the abdominal cavity.  This was quickly evacuated using suction.  The  sigmoid colon was mobilized along its peritoneal reflection.  Care was taken to avoid the left ureter.  A longitudinal perforation was noted at the colorectal juncture with stool noted inferiorly, consistent with obstipation.  This stool was evacuated through the hole.  The hole measured approximately 4 cm in its greatest length.  A TA 60 stapler was placed distal to the perforation and fired.  A GIA 75 stapler was placed across the mid sigmoid colon and fired.  The specimen was then removed from the operative field.  The mesentery was then divided up to the proximal sigmoid colon using the LigaSure.  The descending colon was mobilized along with the peritoneal reflection.  A 2-0 Prolene suture was placed at the rectal stump.  The colostomy was then formed in the mid left abdomen.  The sigmoid colon was then brought out through the colostomy that was formed in a cruciate-like fashion.  The rest of the colon was inspected and noted to be within normal limits.  No frank perforations were seen.  The abdominal cavity was copiously irrigated with warm normal saline until the effluent was clear.  A #10 flat Jackson-Pratt drain was placed into the pelvis and brought through separate stab wound to the right of the midline.  The fascia was reapproximated using a looped 0 Novafil running suture.  A wound VAC was then applied to the subcutaneous tissue with good -125 suction.  The colostomy was matured using 3-0 Chromic Gut interrupted sutures.  An ostomy appliance was then applied.  All tape and needle counts were correct at the end of the procedure.  The patient was left intubated and transferred to the ICU in critical condition.  Complications: None  EBL: 50 cc  Specimen: Sigmoid colon  Drains: #10 Jackson-Pratt drain to pelvis

## 2018-09-29 NOTE — Progress Notes (Signed)
Warming blanket turned off. PT rectal temp 99.1 and stable. Continue to monitor temp.

## 2018-09-29 NOTE — Transfer of Care (Signed)
Immediate Anesthesia Transfer of Care Note  Patient: [name] [name]  Procedure(s) Performed: PARTICAL  COLECTOMY WITH COLOSTOMY   (N/A ) CENTRAL LINE INSERTION (Left Chest)  Patient Location: ICU  Anesthesia Type:General  Level of Consciousness: Patient remains intubated per anesthesia plan  Airway & Oxygen Therapy: Patient remains intubated per anesthesia plan  Post-op Assessment: Report given to RN and Post -op Vital signs reviewed and stable  Post vital signs: Reviewed and stable  Last Vitals:  Vitals Value Taken Time  BP 85/64 09/05/2018 1728  Temp    Pulse 107 09/12/2018 1730  Resp 12 09/11/2018 1730  SpO2 100 % 09/27/2018 1730  Vitals shown include unvalidated device data.  Last Pain:  Vitals:   09/28/2018 1400  TempSrc:   PainSc: 10-Worst pain ever         Complications: No apparent anesthesia complications

## 2018-09-29 NOTE — ED Notes (Signed)
Dr. Jenkins at bedside. 

## 2018-09-29 NOTE — ED Notes (Signed)
CRITICAL VALUE ALERT  Critical Value:  Crit Lactic acid 8.8  Date & Time Notied:  09/27/2018, 5009  Provider Notified: Dr. Thurnell Garbe  Orders Received/Actions taken: no new orders

## 2018-09-29 NOTE — Progress Notes (Signed)
CT pending lab results  

## 2018-09-29 NOTE — Progress Notes (Signed)
Spoke with [name] [name] about update and status. Instructed to page critical care for input and medications if appropriate. Critical Care to be contacted. Continue to monitor

## 2018-09-29 NOTE — Anesthesia Postprocedure Evaluation (Signed)
Anesthesia Post Note  Patient: [name] [name]  Procedure(s) Performed: PARTICAL  COLECTOMY WITH COLOSTOMY   (N/A ) CENTRAL LINE INSERTION (Left Chest)  Patient location during evaluation: ICU Anesthesia Type: General Level of consciousness: patient remains intubated per anesthesia plan Vital Signs Assessment: post-procedure vital signs reviewed and stable Respiratory status: patient remains intubated per anesthesia plan Cardiovascular status: blood pressure returned to baseline and tachycardic Anesthetic complications: no     Last Vitals:  Vitals:   09/04/2018 1655 09/09/2018 1733  BP:    Pulse:  (!) 106  Resp:  16  Temp:  (!) 34.2 C  SpO2: 100% 100%    Last Pain:  Vitals:   09/27/2018 1733  TempSrc: Rectal  PainSc:                  Elishua Radford C Meghin Thivierge

## 2018-09-29 NOTE — ED Provider Notes (Signed)
[name] EMERGENCY DEPARTMENT Provider Note   CSN: [id number] Arrival date & time: 09/14/2018  4098     History   Chief Complaint Chief Complaint  Patient presents with   Weakness    HPI [name] [name] is a 85 y.o. female.      Weakness Pt was seen at 0825.  Per EMS and pt, c/o gradual onset and persistence of constant generalized abd "pain" for the past several days.  Has been associated with constipation for the past 1 week. Pt states she has taken OTC laxatives without improvement. States she is passing "small stool balls" and straining to have a BM. Pt has noticed blood on toilet paper after wiping after attempt at [facility].  Describes the abd pain as "aching."  Has been associated with generalized weakness. EMS states pt's HR "110-130" en route, and pt's "SBP dropped from 96 to 76" after pt stood for EMS.  Denies N/V, no diarrhea, no fevers, no back pain, no rash, no CP/SOB, no black stools.        Past Medical History:  Diagnosis Date   Blind one eye    Diabetes mellitus    Glaucoma    Hypertension    Hypothyroidism     There are no active problems to display for this patient.   Past Surgical History:  Procedure Laterality Date   APPENDECTOMY     CHOLECYSTECTOMY     EYE SURGERY Bilateral    cataractsw/IOL   THYROID SURGERY     TONSILLECTOMY     TRABECULECTOMY Left 08/22/2012   Procedure: TRABECULECTOMY;  Surgeon: Chalmers Guest, MD;  Location: [facility] OR;  Service: Ophthalmology;  Laterality: Left;     OB History   No obstetric history on file.      Home Medications    Prior to Admission medications   Medication Sig Start Date End Date Taking? Authorizing Provider  amLODipine (NORVASC) 10 MG tablet Take 10 mg by mouth daily.    [provider]  aspirin EC 81 MG tablet Take 81 mg by mouth daily.    [provider]  bimatoprost (LUMIGAN) 0.01 % SOLN Place 1 drop into both eyes at bedtime.    [provider]  brimonidine  (ALPHAGAN) 0.15 % ophthalmic solution Place 1 drop into both eyes 2 (two) times daily.    [provider]  dorzolamide-timolol (COSOPT) 22.3-6.8 MG/ML ophthalmic solution Place 1 drop into both eyes 2 (two) times daily.    [provider]  ibuprofen (ADVIL,MOTRIN) 200 MG tablet Take 200 mg by mouth 2 (two) times daily.    [provider]  levothyroxine (SYNTHROID, LEVOTHROID) 112 MCG tablet Take 112 mcg by mouth daily before breakfast.    [provider]  metFORMIN (GLUCOPHAGE) 500 MG tablet Take 500 mg by mouth 2 (two) times daily with a meal.    [provider]    Family History No family history on file.  Social History Social History   Tobacco Use   Smoking status: Never Smoker   Smokeless tobacco: Never Used  Substance Use Topics   Alcohol use: No   Drug use: No     Allergies   Hydrocodone   Review of Systems Review of Systems  Neurological: Positive for weakness.  ROS: Statement: All systems negative except as marked or noted in the HPI; Constitutional: Negative for fever and chills. +generalized weakness. ; ; Eyes: Negative for eye pain, redness and discharge. ; ; ENMT: Negative for ear pain, hoarseness,  nasal congestion, sinus pressure and sore throat. ; ; Cardiovascular: Negative for chest pain, palpitations, diaphoresis, dyspnea and peripheral edema. ; ; Respiratory: Negative for cough, wheezing and stridor. ; ; Gastrointestinal: Negative for nausea, vomiting, diarrhea, blood in stool, hematemesis, jaundice and +abd pain, constipation, rectal bleeding. . ; ; Genitourinary: Negative for dysuria, flank pain and hematuria. ; ; Musculoskeletal: Negative for back pain and neck pain. Negative for swelling and trauma.; ; Skin: Negative for pruritus, rash, abrasions, blisters, bruising and skin lesion.; ; Neuro: Negative for headache, lightheadedness and neck stiffness. Negative for altered level of consciousness, altered mental status,  extremity weakness, paresthesias, involuntary movement, seizure and syncope.       Physical Exam Updated Vital Signs BP (!) 89/61    Pulse (!) 112    Temp 99.9 F (37.7 C) (Oral)    Resp (!) 26    Ht 5\' 3"  (1.6 m)    Wt 68 kg    SpO2 100%    BMI 26.57 kg/m     Patient Vitals for the past 24 hrs:  BP Temp Temp src Pulse Resp SpO2 Height Weight  09/01/2018 1230 108/63 -- -- 93 (!) 28 98 % -- --  09/06/2018 1200 101/83 -- -- 98 (!) 30 100 % -- --  09/06/2018 1146 102/71 -- -- 99 (!) 22 98 % -- --  09/22/2018 1125 (!) 86/71 -- -- (!) 108 (!) 31 100 % -- --  09/09/2018 1121 (!) 72/60 -- -- -- (!) 33 -- -- --  09/23/2018 1031 104/72 -- -- -- (!) 38 -- -- --  09/17/2018 1019 (!) 88/60 -- -- (!) 112 (!) 34 100 % -- --  09/16/2018 0930 (!) 88/69 -- -- (!) 110 (!) 27 97 % -- --  09/25/2018 0900 99/67 -- -- -- (!) 24 -- -- --  09/10/2018 0833 (!) 89/61 99.9 F (37.7 C) Oral -- -- -- -- --  09/27/2018 0826 -- -- -- -- -- -- 5\' 3"  (1.6 m) 68 kg  09/03/2018 0821 91/70 -- -- (!) 112 (!) 26 100 % -- --   09:00 Orthostatic Vital Signs VB  Orthostatic Lying   BP- Lying: 99/67  Pulse- Lying: 114      Orthostatic Sitting  BP- Sitting: 92/62  Pulse- Sitting: 112      Orthostatic Standing at 0 minutes  BP- Standing at 0 minutes: 88/67Abnormal   Pulse- Standing at 0 minutes: 127     Physical Exam 0830: Physical examination:  Nursing notes reviewed; Vital signs and O2 SAT reviewed;  Constitutional: Well developed, Well nourished, Well hydrated, In no acute distress; Head:  Normocephalic, atraumatic; Eyes: EOMI, PERRL, No scleral icterus; ENMT: Mouth and pharynx normal, Mucous membranes moist; Neck: Supple, Full range of motion, No lymphadenopathy; Cardiovascular: Tachycardic rate and rhythm, No gallop; Respiratory: Breath sounds clear & equal bilaterally, No wheezes.  Speaking full sentences with ease, Normal respiratory effort/excursion; Chest: Nontender, Movement normal; Abdomen: Soft, +diffuse tenderness,  +distended, Normal bowel sounds; Genitourinary: No CVA tenderness; Extremities: Peripheral pulses normal, No tenderness, No edema, No calf edema or asymmetry.; Neuro: AA&Ox3, Major CN grossly intact.  Speech clear. No gross focal motor or sensory deficits in extremities.; Skin: Color normal, Warm, Dry.   ED Treatments / Results  Labs (all labs ordered are listed, but only abnormal results are displayed)   EKG EKG Interpretation  Date/Time:  Saturday September 29 2018 08:22:27 EDT Ventricular Rate:  114 PR Interval:    QRS Duration: 73 QT Interval:  265 QTC Calculation: 365 R Axis:   52 Text Interpretation:  Sinus tachycardia Abnormal R-wave progression, early transition Nonspecific T abnormalities, diffuse leads When compared with ECG of 08/16/2012 Rate faster Nonspecific ST and T wave abnormality is now Present Confirmed by Samuel JesterMcManus, Virgel Haro [phone]) on 08/03/2018 9:09:19 AM   Radiology   Procedures Procedures (including critical care time)  Medications Ordered in ED Medications  0.9 %  sodium chloride infusion (has no administration in time range)  sodium chloride 0.9 % bolus 500 mL (500 mLs Intravenous New Bag/Given 10/03/18 0846)     Initial Impression / Assessment and Plan / ED Course  I have reviewed the triage vital signs and the nursing notes.  Pertinent labs & imaging results that were available during my care of the patient were reviewed by me and considered in my medical decision making (see chart for details).     MDM Reviewed: previous chart, nursing note and vitals Reviewed previous: labs and ECG Interpretation: labs, ECG, x-ray and CT scan Total time providing critical care: 30-74 minutes. This excludes time spent performing separately reportable procedures and services. Consults: admitting MD   CRITICAL CARE Performed by: Samuel JesterKathleen Tehillah Cipriani Total critical care time: 45 minutes Critical care time was exclusive of separately billable procedures and treating  other patients. Critical care was necessary to treat or prevent imminent or life-threatening deterioration. Critical care was time spent personally by me on the following activities: development of treatment plan with patient and/or surrogate as well as nursing, discussions with consultants, evaluation of patient's response to treatment, examination of patient, obtaining history from patient or surrogate, ordering and performing treatments and interventions, ordering and review of laboratory studies, ordering and review of radiographic studies, pulse oximetry and re-evaluation of patient's condition.   Results for orders placed or performed during the hospital encounter of 009/02/20  SARS Coronavirus 2 [facility](Hospital order, [facility]) Nasopharyngeal Nasopharyngeal Swab   Specimen: Nasopharyngeal Swab  Result Value Ref Range   SARS Coronavirus 2 NEGATIVE NEGATIVE  Comprehensive metabolic panel  Result Value Ref Range   Sodium 139 135 - 145 mmol/L   Potassium 4.6 3.5 - 5.1 mmol/L   Chloride 103 98 - 111 mmol/L   CO2 20 (L) 22 - 32 mmol/L   Glucose, Bld 248 (H) 70 - 99 mg/dL   BUN 31 (H) 8 - 23 mg/dL   Creatinine, Ser 6.041.73 (H) 0.44 - 1.00 mg/dL   Calcium 54.010.8 (H) 8.9 - 10.3 mg/dL   Total Protein 6.7 6.5 - 8.1 g/dL   Albumin 3.4 (L) 3.5 - 5.0 g/dL   AST 70 (H) 15 - 41 U/L   ALT 42 0 - 44 U/L   Alkaline Phosphatase 87 38 - 126 U/L   Total Bilirubin 1.2 0.3 - 1.2 mg/dL   GFR calc non Af Amer 26 (L) >60 mL/min   GFR calc Af Amer 31 (L) >60 mL/min   Anion gap 16 (H) 5 - 15  Lipase, blood  Result Value Ref Range   Lipase 32 11 - 51 U/L  Lactic acid, plasma  Result Value Ref Range   Lactic Acid, Venous 8.8 (HH) 0.5 - 1.9 mmol/L  Lactic acid, plasma  Result Value Ref Range   Lactic Acid, Venous 8.4 (HH) 0.5 - 1.9 mmol/L  CBC with Differential  Result Value Ref Range   WBC 3.4 (L) 4.0 - 10.5 K/uL   RBC 5.44 (H) 3.87 - 5.11 MIL/uL   Hemoglobin 15.1 (H) 12.0 -  15.0  g/dL   HCT 63.7 (H) 85.8 - 85.0 %   MCV 87.9 80.0 - 100.0 fL   MCH 27.8 26.0 - 34.0 pg   MCHC 31.6 30.0 - 36.0 g/dL   RDW 27.7 41.2 - 87.8 %   Platelets 409 (H) 150 - 400 K/uL   nRBC 0.0 0.0 - 0.2 %   Neutrophils Relative % 79 %   Neutro Abs 2.6 1.7 - 7.7 K/uL   Lymphocytes Relative 17 %   Lymphs Abs 0.6 (L) 0.7 - 4.0 K/uL   Monocytes Relative 3 %   Monocytes Absolute 0.1 0.1 - 1.0 K/uL   Eosinophils Relative 0 %   Eosinophils Absolute 0.0 0.0 - 0.5 K/uL   Basophils Relative 1 %   Basophils Absolute 0.0 0.0 - 0.1 K/uL   WBC Morphology MILD LEFT SHIFT (1-5% METAS, OCC MYELO, OCC BANDS)    Immature Granulocytes 0 %   Abs Immature Granulocytes 0.01 0.00 - 0.07 K/uL   Abnormal Lymphocytes Present PRESENT   Protime-INR  Result Value Ref Range   Prothrombin Time 15.0 11.4 - 15.2 seconds   INR 1.2 0.8 - 1.2  Urinalysis, Routine w reflex microscopic  Result Value Ref Range   Color, Urine AMBER (A) YELLOW   APPearance CLOUDY (A) CLEAR   Specific Gravity, Urine 1.021 1.005 - 1.030   pH 5.0 5.0 - 8.0   Glucose, UA NEGATIVE NEGATIVE mg/dL   Hgb urine dipstick NEGATIVE NEGATIVE   Bilirubin Urine NEGATIVE NEGATIVE   Ketones, ur NEGATIVE NEGATIVE mg/dL   Protein, ur 30 (A) NEGATIVE mg/dL   Nitrite NEGATIVE NEGATIVE   Leukocytes,Ua TRACE (A) NEGATIVE   RBC / HPF 0-5 0 - 5 RBC/hpf   WBC, UA 6-10 0 - 5 WBC/hpf   Bacteria, UA MANY (A) NONE SEEN   Squamous Epithelial / LPF 6-10 0 - 5   Mucus PRESENT    Hyaline Casts, UA PRESENT   POC occult blood, ED  Result Value Ref Range   Fecal Occult Bld POSITIVE (A) NEGATIVE  I-stat chem 8, ED (not at [facility] or [facility])  Result Value Ref Range   Sodium 135 135 - 145 mmol/L   Potassium 4.3 3.5 - 5.1 mmol/L   Chloride 109 98 - 111 mmol/L   BUN 30 (H) 8 - 23 mg/dL   Creatinine, Ser 6.76 (H) 0.44 - 1.00 mg/dL   Glucose, Bld 720 (H) 70 - 99 mg/dL   Calcium, Ion 9.47 (L) 1.15 - 1.40 mmol/L   TCO2 13 (L) 22 - 32 mmol/L   Hemoglobin 14.3 12.0 - 15.0  g/dL   HCT 09.6 28.3 - 66.2 %  Troponin I (High Sensitivity)  Result Value Ref Range   Troponin I (High Sensitivity) 8 <18 ng/L  Troponin I (High Sensitivity)  Result Value Ref Range   Troponin I (High Sensitivity) 8 <18 ng/L   Ct Abdomen Pelvis Wo Contrast Result Date: 09/07/2018 CLINICAL DATA:  Abdominal pain and distension. EXAM: CT ABDOMEN AND PELVIS WITHOUT CONTRAST TECHNIQUE: Multidetector CT imaging of the abdomen and pelvis was performed following the standard protocol without IV contrast. COMPARISON:  None. FINDINGS: Lower chest: No acute abnormality. Hepatobiliary: No focal liver abnormality is seen. Status post cholecystectomy. No biliary dilatation. Pancreas: Unremarkable. No pancreatic ductal dilatation or surrounding inflammatory changes. Spleen: Normal in size without focal abnormality. Adrenals/Urinary Tract: Adrenal glands are unremarkable. Kidneys demonstrate no hydronephrosis. Probable mild renal atrophy bilaterally. Anterior right renal cyst measures 4.7 cm with simple cystic internal  density. Bladder is decompressed. Stomach/Bowel: There is evidence of bowel perforation with moderate scattered free intraperitoneal air throughout the peritoneal cavity. There is associated scattered free fluid throughout the peritoneal cavity. There appears to be some extraluminal stool in the left lower quadrant/left anterior pelvis which would be consistent with probable perforation at the level of the proximal sigmoid colon. There also is moderate fecal material in the rectum with probable relative rectal impaction. No significant diverticular disease. No associated small bowel obstruction. The stomach does appear potentially thickened but is under distended. Vascular/Lymphatic: No significant vascular findings are present. No enlarged abdominal or pelvic lymph nodes. Reproductive: Status post hysterectomy. No adnexal masses. Other: No hernias identified. Musculoskeletal: Degenerative disc disease of  the lower lumbar spine. IMPRESSION: Evidence of bowel perforation with moderate scattered free air throughout the peritoneal cavity as well as scattered free fluid. There does appear to be some extraluminal stool-like material in the left lower pelvis anteriorly which may be consistent with a perforation at the level of the sigmoid colon. There is also associated likely relative rectal fecal impaction. No associated small bowel obstruction. These results were called by telephone at the time of interpretation on 09/26/2018 at 11:13 am to Dr. Samuel JesterKATHLEEN Orlyn Odonoghue , who verbally acknowledged these results. Electronically Signed   By: Irish LackGlenn  Yamagata M.D.   On: 09/27/2018 11:21   Dg Chest 2 View Result Date: 09/16/2018 CLINICAL DATA:  Generalized abdominal pain.  Constipation. EXAM: CHEST - 2 VIEW COMPARISON:  August 16, 2012 FINDINGS: The heart size and mediastinal contours are within normal limits. Both lungs are clear. The visualized skeletal structures are unremarkable. IMPRESSION: No active cardiopulmonary disease. Electronically Signed   By: Gerome Samavid  Williams III M.D   On: 09/02/2018 10:54    [name] [name] was evaluated in Emergency Department on 09/22/2018 for the symptoms described in the history of present illness. She was evaluated in the context of the global COVID-19 pandemic, which necessitated consideration that the patient might be at risk for infection with the SARS-CoV-2 virus that causes COVID-19. Institutional protocols and algorithms that pertain to the evaluation of patients at risk for COVID-19 are in a state of rapid change based on information released by regulatory bodies including the CDC and federal and state organizations. These policies and algorithms were followed during the patient's care in the ED.   1115:  IVF NS boluses given for hypotension with slow improvement. CT A/P as above; IV abx started. Dx and testing d/w pt and family.  Questions answered.  Verb understanding, agreeable  to admit.  T/C returned from General Surgery Dr. Lovell SheehanJenkins, case discussed, including:  HPI, pertinent PM/SHx, VS/PE, dx testing, ED course and treatment:  Agreeable to come to ED for evaluation.   1245:  COVID negative. IVF boluses continue with slow improvement in BP. General Surgery to take pt to OR.       Final Clinical Impressions(s) / ED Diagnoses   Final diagnoses:  None    ED Discharge Orders    None       Samuel JesterMcManus, Marqual Mi, DO 30-May-2018 1050

## 2018-09-29 NOTE — Progress Notes (Signed)
Critical LA 5.6. Called 09/20/2018 2355. Reported to [place]. Continue to monitor.

## 2018-09-29 NOTE — Progress Notes (Addendum)
[place] Progress Note Patient Name: [name] [name] DOB: 08/02/1931 MRN: [id number]   Date of Service  10-01-18  HPI/Events of Note  Severe Sepsis/Septic Shock - d/t intra-abdominal source. Currently on Zosyn alone. ABG = 7.36/18.9/133. The pH on the ABG is OK, however, the pCO2 is blown down to 18.9 to compensate for the severe metabolic acidosis. Lactic Acid = 8.8 --> 8.3 --> 5.3. Lactic Acid is slowly improving. CVP = 11-14. Will broaden Abx coverage   eICU Interventions  Will order: 1. CBC now. 2. COOX now. 3. Vancomycin per pharmacy consult 4. Eraxis 200 mg IV now and then 100 mg IV Q 24 hours.      Intervention Category Major Interventions: Infection - evaluation and management  Sommer,Steven Eugene 10-01-2018, 11:35 PM

## 2018-09-29 NOTE — ED Triage Notes (Signed)
Pt brought in by [facility] with c/o generalized abdominal pain, constipation since last week (has taken OTC medications without relief), abdominal distension, generalized weakness, diaphoresis. HR ranging from 110-133 for EMS. CBG 201. SBP 96 dropping to 76 after standing for EMS

## 2018-09-30 LAB — COMPREHENSIVE METABOLIC PANEL
ALT: 100 U/L — ABNORMAL HIGH (ref 0–44)
AST: 127 U/L — ABNORMAL HIGH (ref 15–41)
Albumin: 1.3 g/dL — ABNORMAL LOW (ref 3.5–5.0)
Alkaline Phosphatase: 45 U/L (ref 38–126)
Anion gap: 11 (ref 5–15)
BUN: 35 mg/dL — ABNORMAL HIGH (ref 8–23)
CO2: 11 mmol/L — ABNORMAL LOW (ref 22–32)
Calcium: 6.5 mg/dL — ABNORMAL LOW (ref 8.9–10.3)
Chloride: 118 mmol/L — ABNORMAL HIGH (ref 98–111)
Creatinine, Ser: 2.07 mg/dL — ABNORMAL HIGH (ref 0.44–1.00)
GFR calc Af Amer: 25 mL/min — ABNORMAL LOW (ref >60)
GFR calc non Af Amer: 21 mL/min — ABNORMAL LOW (ref >60)
Glucose, Bld: 182 mg/dL — ABNORMAL HIGH (ref 70–99)
Potassium: 5.2 mmol/L — ABNORMAL HIGH (ref 3.5–5.1)
Sodium: 140 mmol/L (ref 135–145)
Total Bilirubin: 0.5 mg/dL (ref 0.3–1.2)
Total Protein: <3 g/dL — ABNORMAL LOW (ref 6.5–8.1)

## 2018-09-30 LAB — CBC
HCT: 33.7 % — ABNORMAL LOW (ref 36.0–46.0)
Hemoglobin: 10.2 g/dL — ABNORMAL LOW (ref 12.0–15.0)
MCH: 27.6 pg (ref 26.0–34.0)
MCHC: 30.3 g/dL (ref 30.0–36.0)
MCV: 91.1 fL (ref 80.0–100.0)
Platelets: 211 10*3/uL (ref 150–400)
RBC: 3.7 MIL/uL — ABNORMAL LOW (ref 3.87–5.11)
RDW: 14.1 % (ref 11.5–15.5)
WBC: 3.3 10*3/uL — ABNORMAL LOW (ref 4.0–10.5)
nRBC: 0.6 % — ABNORMAL HIGH (ref 0.0–0.2)

## 2018-09-30 LAB — CBC WITH DIFFERENTIAL/PLATELET
Abs Immature Granulocytes: 0.03 10*3/uL (ref 0.00–0.07)
Basophils Absolute: 0 10*3/uL (ref 0.0–0.1)
Basophils Relative: 0 %
Eosinophils Absolute: 0 10*3/uL (ref 0.0–0.5)
Eosinophils Relative: 2 %
HCT: 37.1 % (ref 36.0–46.0)
Hemoglobin: 11.6 g/dL — ABNORMAL LOW (ref 12.0–15.0)
Immature Granulocytes: 2 %
Lymphocytes Relative: 25 %
Lymphs Abs: 0.5 10*3/uL — ABNORMAL LOW (ref 0.7–4.0)
MCH: 27.6 pg (ref 26.0–34.0)
MCHC: 31.3 g/dL (ref 30.0–36.0)
MCV: 88.3 fL (ref 80.0–100.0)
Monocytes Absolute: 0.1 10*3/uL (ref 0.1–1.0)
Monocytes Relative: 5 %
Neutro Abs: 1.3 10*3/uL — ABNORMAL LOW (ref 1.7–7.7)
Neutrophils Relative %: 66 %
Platelets: 264 10*3/uL (ref 150–400)
RBC: 4.2 MIL/uL (ref 3.87–5.11)
RDW: 14 % (ref 11.5–15.5)
WBC: 2 10*3/uL — ABNORMAL LOW (ref 4.0–10.5)
nRBC: 0 % (ref 0.0–0.2)

## 2018-09-30 LAB — COOXEMETRY PANEL
Carboxyhemoglobin: 0.6 % (ref 0.5–1.5)
Methemoglobin: 0.7 % (ref 0.0–1.5)
O2 Saturation: 96.8 %
Total hemoglobin: 11.8 g/dL — ABNORMAL LOW (ref 12.0–16.0)

## 2018-09-30 LAB — GLUCOSE, CAPILLARY
Glucose-Capillary: 174 mg/dL — ABNORMAL HIGH (ref 70–99)
Glucose-Capillary: 59 mg/dL — ABNORMAL LOW (ref 70–99)
Glucose-Capillary: 75 mg/dL (ref 70–99)
Glucose-Capillary: 87 mg/dL (ref 70–99)

## 2018-09-30 LAB — PHOSPHORUS: Phosphorus: 4.5 mg/dL (ref 2.5–4.6)

## 2018-09-30 LAB — LACTIC ACID, PLASMA
Lactic Acid, Venous: 6.4 mmol/L (ref 0.5–1.9)
Lactic Acid, Venous: 7.4 mmol/L (ref 0.5–1.9)

## 2018-09-30 LAB — CORTISOL: Cortisol, Plasma: 36.1 ug/dL

## 2018-09-30 LAB — MAGNESIUM: Magnesium: 1.5 mg/dL — ABNORMAL LOW (ref 1.7–2.4)

## 2018-09-30 MED ORDER — PHENYLEPHRINE HCL-NACL 10-0.9 MG/250ML-% IV SOLN
INTRAVENOUS | Status: AC
  Filled 2018-09-30: qty 250

## 2018-09-30 MED ORDER — VANCOMYCIN HCL 500 MG IV SOLR
INTRAVENOUS | Status: AC
  Filled 2018-09-30: qty 500

## 2018-09-30 MED ORDER — NOREPINEPHRINE 4 MG/250ML-% IV SOLN
INTRAVENOUS | Status: AC
  Filled 2018-09-30: qty 250

## 2018-09-30 MED ORDER — SODIUM BICARBONATE 8.4 % IV SOLN
INTRAVENOUS | Status: AC
  Filled 2018-09-30: qty 150

## 2018-09-30 MED ORDER — STERILE WATER FOR INJECTION IV SOLN
INTRAVENOUS | Status: DC
  Administered 2018-09-30: 06:00:00 via INTRAVENOUS
  Filled 2018-09-30 (×4): qty 850

## 2018-09-30 MED ORDER — DEXTROSE 50 % IV SOLN
12.5000 g | INTRAVENOUS | Status: AC
  Administered 2018-09-30: 03:00:00 50 mL via INTRAVENOUS

## 2018-09-30 MED ORDER — PHENYLEPHRINE HCL (PRESSORS) 10 MG/ML IV SOLN
INTRAVENOUS | Status: AC
  Filled 2018-09-30: qty 1

## 2018-09-30 MED ORDER — VASOPRESSIN 20 UNIT/ML IV SOLN
0.0300 [IU]/min | INTRAVENOUS | Status: DC
  Administered 2018-09-30: 01:00:00 0.03 [IU]/min via INTRAVENOUS
  Filled 2018-09-30: qty 2

## 2018-09-30 MED ORDER — AMIODARONE LOAD VIA INFUSION
150.0000 mg | Freq: Once | INTRAVENOUS | Status: AC
  Administered 2018-09-30: 150 mg via INTRAVENOUS
  Filled 2018-09-30: qty 83.34

## 2018-09-30 MED ORDER — AMIODARONE HCL IN DEXTROSE 360-4.14 MG/200ML-% IV SOLN
60.0000 mg/h | INTRAVENOUS | Status: DC
  Administered 2018-09-30: 05:00:00 60 mg/h via INTRAVENOUS
  Filled 2018-09-30: qty 200

## 2018-09-30 MED ORDER — DEXTROSE 50 % IV SOLN
INTRAVENOUS | Status: AC
  Administered 2018-09-30: 50 mL via INTRAVENOUS
  Filled 2018-09-30: qty 50

## 2018-09-30 MED ORDER — PHENYLEPHRINE HCL-NACL 10-0.9 MG/250ML-% IV SOLN
0.0000 ug/min | INTRAVENOUS | Status: DC
  Administered 2018-09-30: 01:00:00 50 ug/min via INTRAVENOUS
  Administered 2018-09-30: 05:00:00 170 ug/min via INTRAVENOUS
  Administered 2018-09-30: 03:00:00 120 ug/min via INTRAVENOUS
  Administered 2018-09-30: 180 ug/min via INTRAVENOUS
  Filled 2018-09-30 (×2): qty 250
  Filled 2018-09-30: qty 500
  Filled 2018-09-30: qty 250

## 2018-09-30 MED ORDER — FUROSEMIDE 10 MG/ML IJ SOLN
40.0000 mg | Freq: Once | INTRAMUSCULAR | Status: AC
  Administered 2018-09-30: 05:00:00 40 mg via INTRAVENOUS
  Filled 2018-09-30: qty 4

## 2018-09-30 MED ORDER — AMIODARONE HCL IN DEXTROSE 360-4.14 MG/200ML-% IV SOLN: 30.0000 mg/h | INTRAVENOUS | Status: DC

## 2018-09-30 MED ORDER — DEXTROSE 50 % IV SOLN
INTRAVENOUS | Status: AC
  Administered 2018-09-30: 07:00:00
  Filled 2018-09-30: qty 50

## 2018-09-30 MED ORDER — DEXTROSE 50 % IV SOLN
12.5000 g | INTRAVENOUS | Status: AC
  Administered 2018-09-30: 12.5 g via INTRAVENOUS

## 2018-09-30 MED ORDER — VASOPRESSIN 20 UNIT/ML IV SOLN
INTRAVENOUS | Status: AC
  Filled 2018-09-30: qty 2

## 2018-09-30 MED ORDER — VANCOMYCIN HCL 1000 MG IV SOLR
INTRAVENOUS | Status: AC
  Filled 2018-09-30: qty 1000

## 2018-10-01 ENCOUNTER — Encounter (HOSPITAL_COMMUNITY): Payer: Self-pay | Admitting: General Surgery

## 2018-10-01 MED FILL — Medication: Qty: 1 | Status: AC

## 2018-10-02 LAB — URINE CULTURE: Culture: >=100000 — AB

## 2018-10-02 NOTE — Progress Notes (Signed)
PT heart rate tachy. BP continues to be critically low with Levo infusing. Continuous communication with Elink nurse and MD. Levophed maxed. Temp steadily increasing, monitoring at this time. PT labs collected as ordered. CVP and ART line reading and documented. Eraxis to be couriered in from [employer] and provided via Librarian, academic. PT has been 1:1 with nurse since start of shift. Working diligently to improve and correct abnormal labs and vital signs. Continue to monitor.

## 2018-10-02 NOTE — Progress Notes (Signed)
[facility] Progress Note Patient Name: [name] [name] DOB: 27-Aug-1931 MRN: [id number]   Date of Service  October 06, 2018  HPI/Events of Note  Multiple issues: 1. AFIB with RVR - Norepinephrine IV infusion has been decreased from 60 mcg/min to 28 mcg/min and 2. Oliguria - CVP = 18. Bladder scan with no residual.   eICU Interventions  Will order: 1. Amiodarone IV load and infusion. 2. Continue to wean Norepinephrine IV infusion off.  3. Send AM labs now.  4. Lasix 40 mg IV now.         Sommer,Steven [name] October 06, 2018, 4:47 AM

## 2018-10-02 NOTE — Progress Notes (Signed)
Contacted Elink in regards to HR in 150s and LA. Awaiting orders

## 2018-10-02 NOTE — Progress Notes (Signed)
At 0445, PT began to desat and become bradycardic. Amiodarone stopped and disconnected. Elink paged, Ordered to give Epi and Atropine IV. With no relief and all infusions at max rates, code blue initiated. PT went asystole and compressions were initiated. Life saving medications given, labs and ABG drawn, and compressions continued. Vent settings 100% FiO2, Peep 5, RR 34, and Tidal Volume of 450. All titration medications at max dose to maintain BP and HR.

## 2018-10-02 NOTE — Progress Notes (Signed)
[facility] Progress Note Patient Name: [name] [name] DOB: 21-Sep-1931 MRN: [id number]   Date of Service  09/04/2018  HPI/Events of Note  Lactic Acid = 5.3 --> 5.6. Hgb = 11.4, CVP = 16 and COOX = 96.8. Nothing to optimize here.   eICU Interventions  Continue to trend Lactic Acid.     Intervention Category Major Interventions: Acid-Base disturbance - evaluation and management  Sommer,Steven Eugene 09/28/2018, 12:28 AM

## 2018-10-02 NOTE — Progress Notes (Addendum)
Blood gas results during code, bagging 100%  PH 7.238, PCo2 25.7, PO2 118, HCO3 12.7, BD 15.4, SO2 97.0 , THB 9.6  Temp 37.0 art line

## 2018-10-02 NOTE — Progress Notes (Signed)
PT VS began to deteriorate with all infusions at the maximum dose. PT partial code, ACLS medications to be given. Life saving medications administered with no relief or improvement.  MD and RN pronounced PT. PT time of death 0700. All IV infusions stopped, SCDs and wound vacuum off, suction to NG turned off, and ventilator suspended. Family aware as MD communicated with them the PTs health status and demeanor. Report provided to day shift RN to began process of removing lines, hardware, and equipment.

## 2018-10-02 NOTE — Progress Notes (Signed)
Aline has been re taped and tubing changed with sterile technique. Placed on arm board with gause rapping. Catheter changed to new line as per policy. Blood flow from catheter is very slow when removed from line. Line still has wave form and is  correlating with BP from Manual cuff. Since change line draw has slowly begin to work , but is still positional when drawing blood.

## 2018-10-02 NOTE — Progress Notes (Signed)
Have increased patients oxygen in hopes this will help with her HR which in the 150's and respiratory rate which is 36 now. Suspect her PH -acid balance is becoming worse.She is holding her fluids as kidney function is down. Lips and swelling beginning to appear. She is most likely beginning third space.

## 2018-10-02 NOTE — Progress Notes (Signed)
[facility] Progress Note Patient Name: [name] [name] DOB: 09/26/1931 MRN: [id number]   Date of Service  10/27/18  HPI/Events of Note  AFIB with RVR - Ventricular rate = 128. Currently on a Norepinephrine IV infusion at 60 mcg/min.   eICU Interventions  Will order: 1. Vasopressin IV infusion at shock dose.  2. Phenylephrine IV infusion. Titrate to MAP >= 65.  3. Wean Norepinephrine IV infusion off as tolerated.      Intervention Category Major Interventions: Arrhythmia - evaluation and management  [name] 10/27/2018, 12:46 AM

## 2018-10-02 NOTE — Progress Notes (Signed)
Critical LA of 6.4 call at 0355 08-Oct-2018. Sommer notified. Continue to monitor and await further orders

## 2018-10-02 NOTE — Death Summary Note (Signed)
.trh  DEATH SUMMARY   Patient Details  Name: [name] [name] MRN: [id number] DOB: [birth year]  Admission/Discharge Information   Admit Date:  05-Oct-2018  Date of Death:  Oct 06, 2018  Time of Death:  0700  Length of Stay: 1  Referring Physician: Gareth Morgan, MD   Reason(s) for Hospitalization   Perforated Sigmoid Colon  Diagnoses  Preliminary cause of death:  Secondary Diagnoses (including complications and co-morbidities):  Active Problems:   Fecal peritonitis (HCC)   Perforated sigmoid colon [facility])   Brief Hospital Course (including significant findings, care, treatment, and services provided and events leading to death)  [name] [name] is a 85 y.o. year old female who presented to the emergency room with worsening abdominal pain and distention.  CT scan of the abdomen revealed a probable perforated sigmoid colon with stool outside the lumen.  Free air was also noted.  Patient stated that any movement causes her abdomen to hurt. Patient went to surgery. She was not able to be extubated afterwards. Patient was severely hypotensive and was started on Levo and Vasopressin. Patient developed an irregular heart rate. She was started on an amiodarone drip. Patient then lost a pulse. Code blue was called. ACLS protocol was followed. After approx 15 into the code, we were able to get ahold of family. They reported that patient would not have wanted CPR. We stopped compressions, and patient maintained a BP and HR. When her BP started to decrease she was maxed out on her pressors. We started a bicarb drip, and gave 1 more amp of bicarb. Patient had loss of pulse again. We had used all pharmaceuticals available. Compressions were not restarted per partial code request. Family was then pronounced dead.    Pertinent Labs and Studies  Significant Diagnostic Studies Ct Abdomen Pelvis Wo Contrast  Result Date: 2018/10/05 CLINICAL DATA:  Abdominal pain and distension. EXAM: CT ABDOMEN AND  PELVIS WITHOUT CONTRAST TECHNIQUE: Multidetector CT imaging of the abdomen and pelvis was performed following the standard protocol without IV contrast. COMPARISON:  None. FINDINGS: Lower chest: No acute abnormality. Hepatobiliary: No focal liver abnormality is seen. Status post cholecystectomy. No biliary dilatation. Pancreas: Unremarkable. No pancreatic ductal dilatation or surrounding inflammatory changes. Spleen: Normal in size without focal abnormality. Adrenals/Urinary Tract: Adrenal glands are unremarkable. Kidneys demonstrate no hydronephrosis. Probable mild renal atrophy bilaterally. Anterior right renal cyst measures 4.7 cm with simple cystic internal density. Bladder is decompressed. Stomach/Bowel: There is evidence of bowel perforation with moderate scattered free intraperitoneal air throughout the peritoneal cavity. There is associated scattered free fluid throughout the peritoneal cavity. There appears to be some extraluminal stool in the left lower quadrant/left anterior pelvis which would be consistent with probable perforation at the level of the proximal sigmoid colon. There also is moderate fecal material in the rectum with probable relative rectal impaction. No significant diverticular disease. No associated small bowel obstruction. The stomach does appear potentially thickened but is under distended. Vascular/Lymphatic: No significant vascular findings are present. No enlarged abdominal or pelvic lymph nodes. Reproductive: Status post hysterectomy. No adnexal masses. Other: No hernias identified. Musculoskeletal: Degenerative disc disease of the lower lumbar spine. IMPRESSION: Evidence of bowel perforation with moderate scattered free air throughout the peritoneal cavity as well as scattered free fluid. There does appear to be some extraluminal stool-like material in the left lower pelvis anteriorly which may be consistent with a perforation at the level of the sigmoid colon. There is also  associated likely relative rectal fecal impaction. No associated  small bowel obstruction. These results were called by telephone at the time of interpretation on 09/08/2018 at 11:13 am to Dr. Samuel JesterKATHLEEN MCMANUS , who verbally acknowledged these results. Electronically Signed   By: Irish LackGlenn  Yamagata M.D.   On: 09/13/2018 11:21   Dg Chest 2 View  Result Date: 09/23/2018 CLINICAL DATA:  Generalized abdominal pain.  Constipation. EXAM: CHEST - 2 VIEW COMPARISON:  August 16, 2012 FINDINGS: The heart size and mediastinal contours are within normal limits. Both lungs are clear. The visualized skeletal structures are unremarkable. IMPRESSION: No active cardiopulmonary disease. Electronically Signed   By: Gerome Samavid  Williams III M.D   On: 09/04/2018 10:54   Dg Chest Port 1 View  Result Date: 09/11/2018 CLINICAL DATA:  Central line placement. EXAM: PORTABLE CHEST 1 VIEW COMPARISON:  September 29, 2018 FINDINGS: A left central line terminates in the SVC. An ETT terminates 2.3 cm above the carina in good position. No pneumothorax. The cardiomediastinal silhouette is stable. Mild opacity seen in the bases. No other acute abnormalities. IMPRESSION: 1. Support apparatus as above. The ET tube and left central line are in good position. The distal tip of the NG tube is not definitely seen. 2. Mild bibasilar opacities may represent atelectasis or infiltrates. Recommend clinical correlation and attention on follow-up. Electronically Signed   By: Gerome Samavid  Williams III M.D   On: 09/27/2018 17:54    Microbiology Recent Results (from the past 240 hour(s))  SARS Coronavirus 2 [facility](Hospital order, [facility]) Nasopharyngeal Nasopharyngeal Swab     Status: None   Collection Time: 09/14/2018 11:15 AM   Specimen: Nasopharyngeal Swab  Result Value Ref Range Status   SARS Coronavirus 2 NEGATIVE NEGATIVE Final    Comment: (NOTE) If result is NEGATIVE SARS-CoV-2 target nucleic acids are NOT DETECTED. The SARS-CoV-2 RNA is  generally detectable in upper and lower  respiratory specimens during the acute phase of infection. The lowest  concentration of SARS-CoV-2 viral copies this assay can detect is 250  copies / mL. A negative result does not preclude SARS-CoV-2 infection  and should not be used as the sole basis for treatment or other  patient management decisions.  A negative result may occur with  improper specimen collection / handling, submission of specimen other  than nasopharyngeal swab, presence of viral mutation(s) within the  areas targeted by this assay, and inadequate number of viral copies  (<250 copies / mL). A negative result must be combined with clinical  observations, patient history, and epidemiological information. If result is POSITIVE SARS-CoV-2 target nucleic acids are DETECTED. The SARS-CoV-2 RNA is generally detectable in upper and lower  respiratory specimens dur ing the acute phase of infection.  Positive  results are indicative of active infection with SARS-CoV-2.  Clinical  correlation with patient history and other diagnostic information is  necessary to determine patient infection status.  Positive results do  not rule out bacterial infection or co-infection with other viruses. If result is PRESUMPTIVE POSTIVE SARS-CoV-2 nucleic acids MAY BE PRESENT.   A presumptive positive result was obtained on the submitted specimen  and confirmed on repeat testing.  While 2019 novel coronavirus  (SARS-CoV-2) nucleic acids may be present in the submitted sample  additional confirmatory testing may be necessary for epidemiological  and / or clinical management purposes  to differentiate between  SARS-CoV-2 and other Sarbecovirus currently known to infect humans.  If clinically indicated additional testing with an alternate test  methodology [phone]([id number]) is advised. The SARS-CoV-2 RNA  is generally  detectable in upper and lower respiratory sp ecimens during the acute  phase of  infection. The expected result is Negative. Fact Sheet for Patients:  StrictlyIdeas.no Fact Sheet for Healthcare Providers: BankingDealers.co.za This test is not yet approved or cleared by the [place] FDA and has been authorized for detection and/or diagnosis of SARS-CoV-2 by FDA under an Emergency Use Authorization (EUA).  This EUA will remain in effect (meaning this test can be used) for the duration of the COVID-19 declaration under Section 564(b)(1) of the Act, 21 U.S.C. section 360bbb-3(b)(1), unless the authorization is terminated or revoked sooner. Performed at [facility], [address], [place], [place] [address]   MRSA PCR Screening     Status: None   Collection Time: 09/14/2018  6:55 PM   Specimen: Nasopharyngeal  Result Value Ref Range Status   MRSA by PCR NEGATIVE NEGATIVE Final    Comment:        The GeneXpert MRSA Assay (FDA approved for NASAL specimens only), is one component of a comprehensive MRSA colonization surveillance program. It is not intended to diagnose MRSA infection nor to guide or monitor treatment for MRSA infections. Performed at [facility], [address] [place], [name] [address]     Lab Basic Metabolic Panel: Recent Labs  Lab 09/13/2018 0858 09/08/2018 1020 09/17/2018 2006 26-Oct-2018 0510  NA 139 135 139 140  K 4.6 4.3 3.9 5.2*  CL 103 109 114* 118*  CO2 20*  --  14* 11*  GLUCOSE 248* 221* 132* 182*  BUN 31* 30* 34* 35*  CREATININE 1.73* 1.50* 1.54* 2.07*  CALCIUM 10.8*  --  7.4* 6.5*  MG  --   --   --  1.5*  PHOS  --   --   --  4.5   Liver Function Tests: Recent Labs  Lab 09/13/2018 0858 10/26/2018 0510  AST 70* 127*  ALT 42 100*  ALKPHOS 87 45  BILITOT 1.2 0.5  PROT 6.7 <3.0*  ALBUMIN 3.4* 1.3*   Recent Labs  Lab 09/20/2018 0858  LIPASE 32   No results for input(s): AMMONIA in the last 168 hours. CBC: Recent Labs  Lab 09/20/2018 0858 09/09/2018 1020 09/11/2018 2006  2018/10/26 0019 10-26-2018 0510  WBC 3.4*  --  1.1* 2.0* 3.3*  NEUTROABS 2.6  --   --  1.3*  --   HGB 15.1* 14.3 11.4* 11.6* 10.2*  HCT 47.8* 42.0 36.9 37.1 33.7*  MCV 87.9  --  88.3 88.3 91.1  PLT 409*  --  247 264 211   Cardiac Enzymes: No results for input(s): CKTOTAL, CKMB, CKMBINDEX, TROPONINI in the last 168 hours. Sepsis Labs: Recent Labs  Lab 09/07/2018 0858  09/19/2018 2006 09/10/2018 2007 09/28/2018 2312 10/26/2018 0019 2018-10-26 0318 10-26-18 0510  WBC 3.4*  --  1.1*  --   --  2.0*  --  3.3*  LATICACIDVEN  --    < >  --  5.3* 5.6*  --  6.4* 7.4*   < > = values in this interval not displayed.    Procedures/Operations  Colon perforation repair surgery.   [place] October 26, 2018, 7:02 AM

## 2018-10-02 NOTE — Progress Notes (Signed)
Eraxis medication not available. Contacted Elink. Awaiting futher orders

## 2018-10-02 NOTE — Progress Notes (Addendum)
[facility] Progress Note Patient Name: ADIA CRAMMER DOB: March 30, 1931 MRN: [id number]   Date of Service  10/07/2018  HPI/Events of Note  Patient acutely became bradycardic and then went asystolic. CPR initiated and patient give several rounds of Epi, Atropine and NaHCO3. Now in sinus bradycardia rate = 33. BP = 110/44. Family desires to stop CPR compression. ABG = 7.238/25.7/118/12.7  eICU Interventions  Will order: 1. NaHCO3 100 meq IV now.  2. NaHCO3 IV infusion to run IV at 100 mL/hour.  3. D/C Amiodarone IV infusion. 4. Repeat ABG at 7:15 AM.     Intervention Category Major Interventions: Code management / supervision  Sommer,Steven Eugene 2018-10-07, 5:50 AM

## 2018-10-02 NOTE — Progress Notes (Signed)
Events of this morning noted.  Appreciate critical care and hospitalist assistance.  Will meet with family.

## 2018-10-02 NOTE — Progress Notes (Signed)
BP continues to be soft but improving with medications. Weaning PT down on Levo. HR remains tachy. PT glucose was 59, D50 administered per protocol. Will recheck glucose appropriately.

## 2018-10-02 NOTE — Progress Notes (Signed)
CRITICAL VALUE ALERT  Critical Value:  Lactic acid 7.4  Date & Time Notied:  09/07/2018 @ 0630  Provider Notified: Dr. Clearence Ped  Orders Received/Actions taken: Waiting for orders/call back.

## 2018-10-02 NOTE — Progress Notes (Signed)
Spoke with daughter. Informed and discussed re: Code blue. Daughter reports that mother would not want to be intubated or to have chest compressions. ACLS medications are something her mother would have wanted. Stopped chest compressions. ICU consult started bicarb drip.

## 2018-10-02 DEATH — deceased

## 2018-10-05 MED FILL — Medication: Qty: 1 | Status: AC

## 2020-02-19 IMAGING — CR PORTABLE CHEST - 1 VIEW
2 series · 2 of 2 positions shown · non-contrast
Comparison: September 29, 2018

CLINICAL DATA: Central line placement.

EXAM:
PORTABLE CHEST 1 VIEW

[portable (1 of 2)]
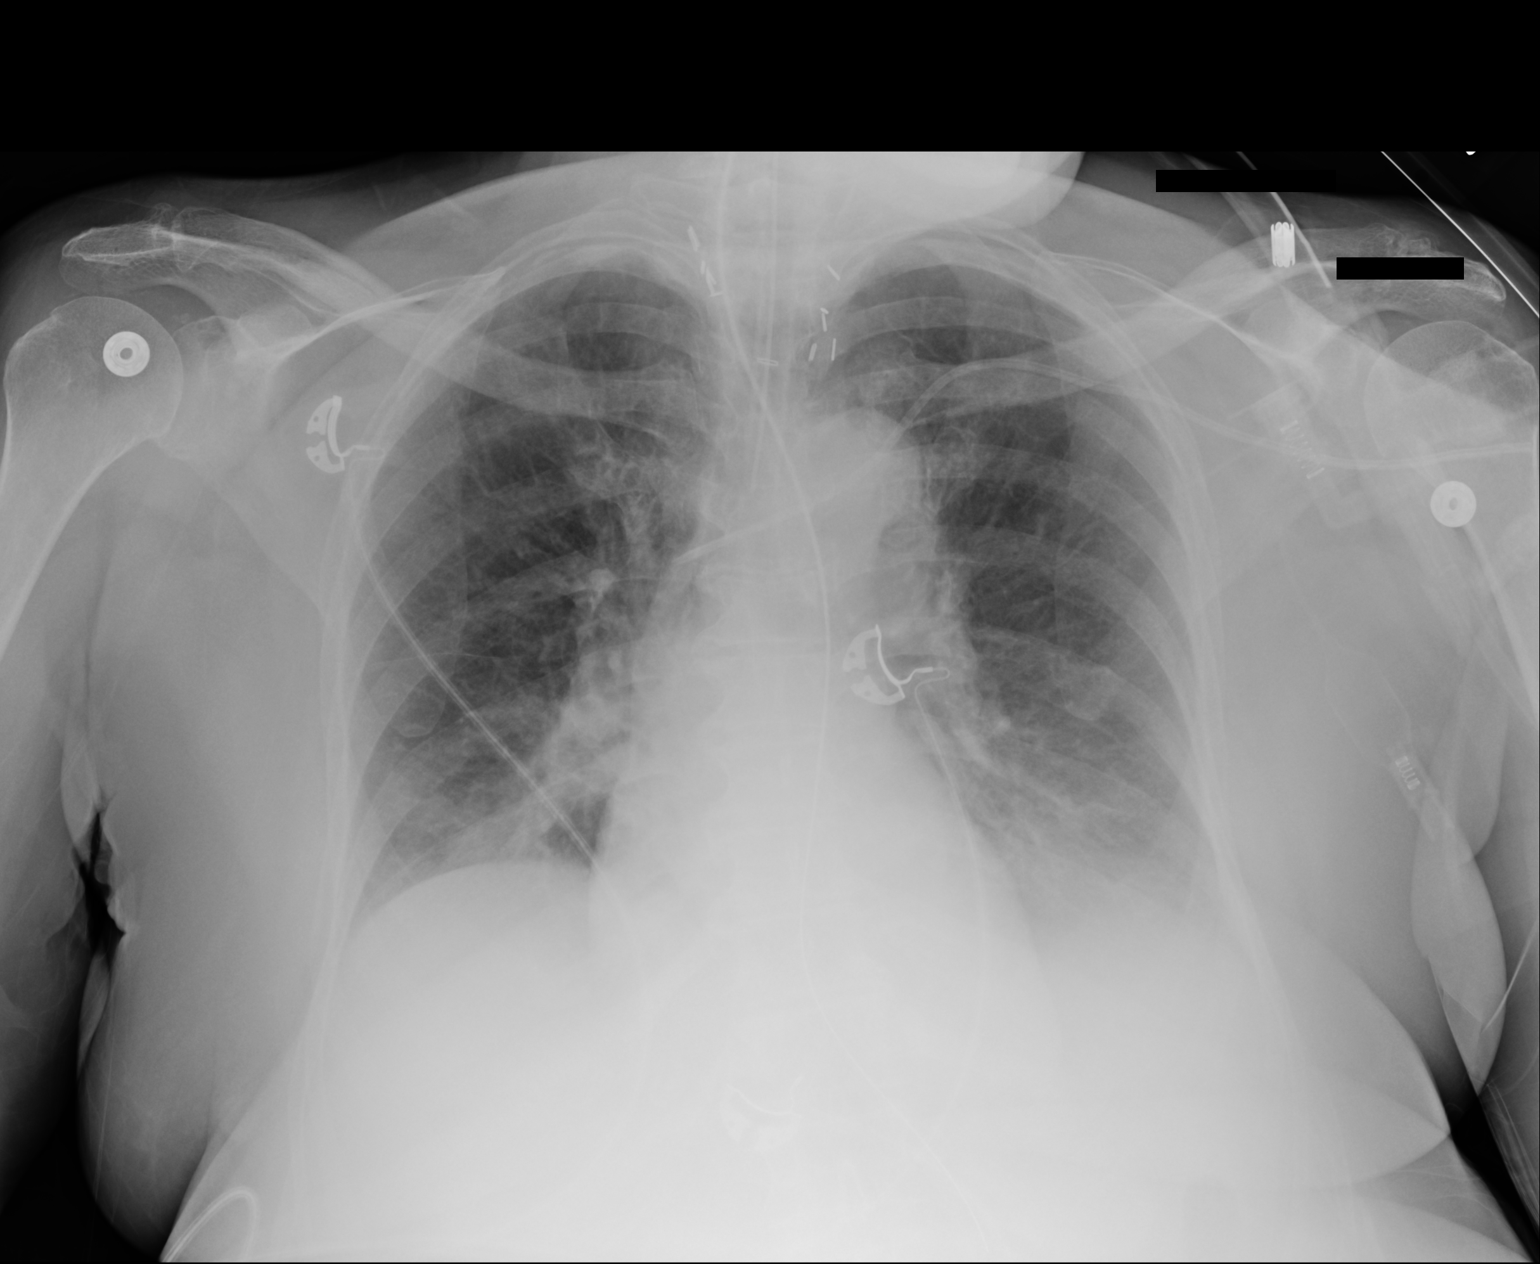

[portable (2 of 2)]
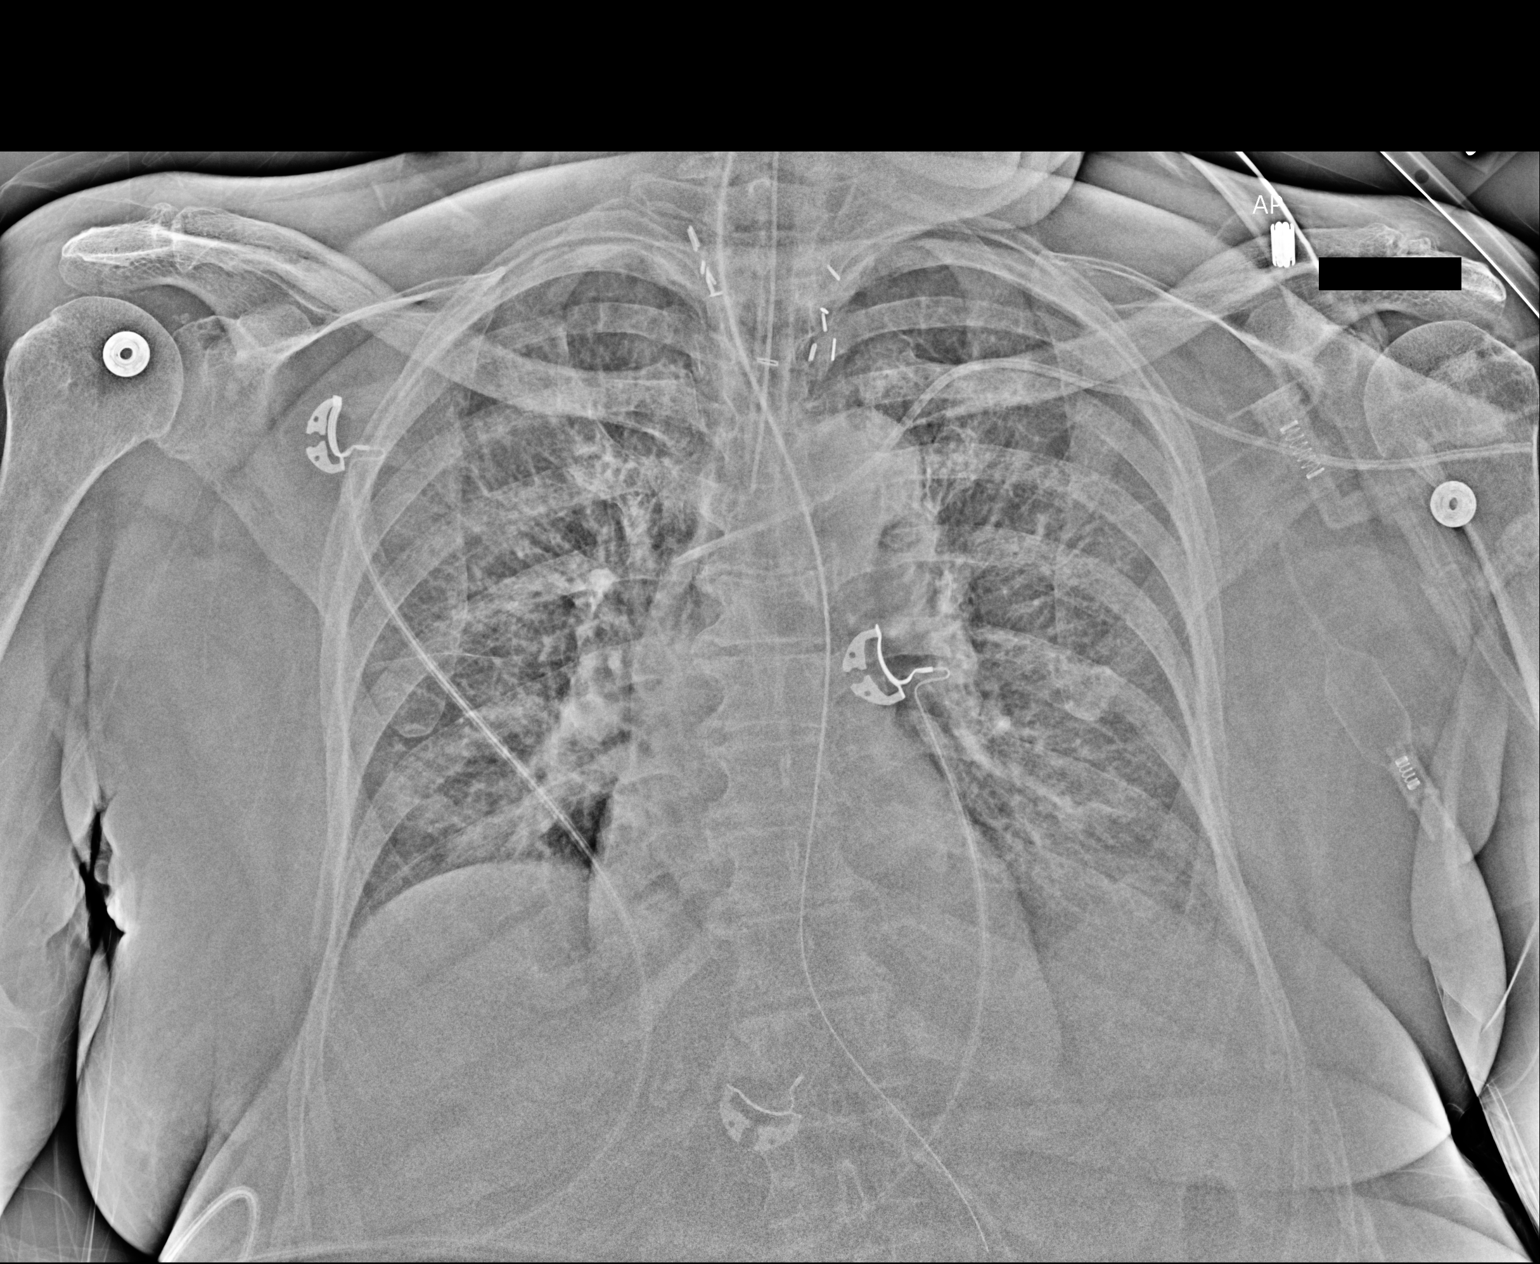

[2 of 2 positions shown; findings below may reference images not displayed]

FINDINGS: A left central line terminates in the SVC. An ETT terminates 2.3 cm
above the carina in good position. No pneumothorax. The
cardiomediastinal silhouette is stable. Mild opacity seen in the
bases. No other acute abnormalities.
IMPRESSION: 1. Support apparatus as above. The ET tube and left central line are
in good position. The distal tip of the NG tube is not definitely
seen.
2. Mild bibasilar opacities may represent atelectasis or
infiltrates. Recommend clinical correlation and attention on
follow-up.
